# Patient Record
Sex: Male | Born: 1980
Health system: Southern US, Community
[De-identification: ages and names within clinical notes are randomized; demographics above are authoritative.]

## PROBLEM LIST (undated history)

## (undated) DIAGNOSIS — S34109A Unspecified injury to unspecified level of lumbar spinal cord, initial encounter: Secondary | ICD-10-CM

## (undated) DIAGNOSIS — R339 Retention of urine, unspecified: Secondary | ICD-10-CM

## (undated) DIAGNOSIS — R58 Hemorrhage, not elsewhere classified: Secondary | ICD-10-CM

## (undated) DIAGNOSIS — G8921 Chronic pain due to trauma: Secondary | ICD-10-CM

## (undated) DIAGNOSIS — D72829 Elevated white blood cell count, unspecified: Secondary | ICD-10-CM

## (undated) DIAGNOSIS — W3400XA Accidental discharge from unspecified firearms or gun, initial encounter: Secondary | ICD-10-CM

## (undated) DIAGNOSIS — K592 Neurogenic bowel, not elsewhere classified: Secondary | ICD-10-CM

## (undated) DIAGNOSIS — N319 Neuromuscular dysfunction of bladder, unspecified: Secondary | ICD-10-CM

## (undated) DIAGNOSIS — E870 Hyperosmolality and hypernatremia: Secondary | ICD-10-CM

## (undated) DIAGNOSIS — F419 Anxiety disorder, unspecified: Secondary | ICD-10-CM

## (undated) DIAGNOSIS — S37039A Laceration of unspecified kidney, unspecified degree, initial encounter: Secondary | ICD-10-CM

## (undated) DIAGNOSIS — G8220 Paraplegia, unspecified: Secondary | ICD-10-CM

## (undated) DIAGNOSIS — S32009A Unspecified fracture of unspecified lumbar vertebra, initial encounter for closed fracture: Secondary | ICD-10-CM

## (undated) HISTORY — PX: OTHER SURGICAL HISTORY: SHX169

---

## 2000-04-02 ENCOUNTER — Emergency Department (HOSPITAL_COMMUNITY): Admission: EM | Admit: 2000-04-02 | Discharge: 2000-04-02 | Payer: Self-pay | Admitting: Emergency Medicine

## 2003-01-08 ENCOUNTER — Emergency Department (HOSPITAL_COMMUNITY): Admission: AD | Admit: 2003-01-08 | Discharge: 2003-01-08 | Payer: Self-pay | Admitting: Family Medicine

## 2003-12-11 ENCOUNTER — Emergency Department (HOSPITAL_COMMUNITY): Admission: EM | Admit: 2003-12-11 | Discharge: 2003-12-11 | Payer: Self-pay | Admitting: Family Medicine

## 2006-01-13 ENCOUNTER — Emergency Department (HOSPITAL_COMMUNITY): Admission: EM | Admit: 2006-01-13 | Discharge: 2006-01-13 | Payer: Self-pay | Admitting: Emergency Medicine

## 2006-01-23 ENCOUNTER — Emergency Department (HOSPITAL_COMMUNITY): Admission: EM | Admit: 2006-01-23 | Discharge: 2006-01-23 | Payer: Self-pay | Admitting: Emergency Medicine

## 2006-12-13 ENCOUNTER — Emergency Department (HOSPITAL_COMMUNITY): Admission: EM | Admit: 2006-12-13 | Discharge: 2006-12-13 | Payer: Self-pay | Admitting: Emergency Medicine

## 2013-08-31 ENCOUNTER — Ambulatory Visit: Payer: BC Managed Care – PPO | Attending: Family Medicine | Admitting: Physical Therapy

## 2013-08-31 DIAGNOSIS — M6281 Muscle weakness (generalized): Secondary | ICD-10-CM | POA: Insufficient documentation

## 2013-08-31 DIAGNOSIS — IMO0001 Reserved for inherently not codable concepts without codable children: Secondary | ICD-10-CM | POA: Insufficient documentation

## 2013-08-31 DIAGNOSIS — R269 Unspecified abnormalities of gait and mobility: Secondary | ICD-10-CM | POA: Insufficient documentation

## 2013-09-01 ENCOUNTER — Ambulatory Visit: Payer: BC Managed Care – PPO | Admitting: Physical Therapy

## 2013-09-08 ENCOUNTER — Ambulatory Visit: Payer: BC Managed Care – PPO | Admitting: Physical Therapy

## 2013-09-08 DIAGNOSIS — IMO0001 Reserved for inherently not codable concepts without codable children: Secondary | ICD-10-CM | POA: Diagnosis not present

## 2013-09-11 ENCOUNTER — Ambulatory Visit: Payer: BC Managed Care – PPO | Admitting: *Deleted

## 2013-09-14 ENCOUNTER — Ambulatory Visit: Payer: BC Managed Care – PPO | Admitting: Physical Therapy

## 2013-09-16 ENCOUNTER — Ambulatory Visit: Payer: BC Managed Care – PPO | Admitting: Physical Therapy

## 2013-09-21 ENCOUNTER — Ambulatory Visit: Payer: BC Managed Care – PPO | Admitting: Physical Therapy

## 2013-09-23 ENCOUNTER — Ambulatory Visit: Payer: BC Managed Care – PPO | Admitting: Physical Therapy

## 2013-09-29 ENCOUNTER — Ambulatory Visit: Payer: BC Managed Care – PPO | Admitting: Physical Therapy

## 2013-10-01 ENCOUNTER — Ambulatory Visit: Payer: BC Managed Care – PPO | Admitting: Physical Therapy

## 2013-10-05 ENCOUNTER — Ambulatory Visit: Payer: BC Managed Care – PPO | Admitting: Physical Therapy

## 2013-10-07 ENCOUNTER — Ambulatory Visit: Payer: BC Managed Care – PPO | Admitting: Physical Therapy

## 2013-10-12 ENCOUNTER — Ambulatory Visit: Payer: BC Managed Care – PPO | Admitting: Physical Therapy

## 2013-10-14 ENCOUNTER — Ambulatory Visit: Payer: BC Managed Care – PPO | Admitting: Physical Therapy

## 2013-10-19 ENCOUNTER — Ambulatory Visit: Payer: BC Managed Care – PPO | Admitting: Physical Therapy

## 2013-10-21 ENCOUNTER — Ambulatory Visit: Payer: BC Managed Care – PPO | Admitting: Physical Therapy

## 2013-10-26 ENCOUNTER — Ambulatory Visit: Payer: BC Managed Care – PPO | Admitting: Physical Therapy

## 2013-10-28 ENCOUNTER — Ambulatory Visit: Payer: BC Managed Care – PPO | Admitting: Physical Therapy

## 2013-11-02 ENCOUNTER — Ambulatory Visit: Payer: BC Managed Care – PPO | Admitting: Physical Therapy

## 2013-11-04 ENCOUNTER — Ambulatory Visit: Payer: BC Managed Care – PPO | Admitting: Physical Therapy

## 2014-06-30 ENCOUNTER — Emergency Department (HOSPITAL_COMMUNITY)
Admission: EM | Admit: 2014-06-30 | Discharge: 2014-06-30 | Disposition: A | Discharge status: Home / Self Care | Payer: Self-pay | Attending: Emergency Medicine | Admitting: Emergency Medicine

## 2014-06-30 ENCOUNTER — Emergency Department (HOSPITAL_COMMUNITY): Payer: Self-pay

## 2014-06-30 ENCOUNTER — Encounter (HOSPITAL_COMMUNITY): Payer: Self-pay | Admitting: Emergency Medicine

## 2014-06-30 DIAGNOSIS — Z862 Personal history of diseases of the blood and blood-forming organs and certain disorders involving the immune mechanism: Secondary | ICD-10-CM | POA: Insufficient documentation

## 2014-06-30 DIAGNOSIS — Z8639 Personal history of other endocrine, nutritional and metabolic disease: Secondary | ICD-10-CM | POA: Insufficient documentation

## 2014-06-30 DIAGNOSIS — G8921 Chronic pain due to trauma: Secondary | ICD-10-CM | POA: Insufficient documentation

## 2014-06-30 DIAGNOSIS — Z87448 Personal history of other diseases of urinary system: Secondary | ICD-10-CM | POA: Insufficient documentation

## 2014-06-30 DIAGNOSIS — K8020 Calculus of gallbladder without cholecystitis without obstruction: Secondary | ICD-10-CM | POA: Insufficient documentation

## 2014-06-30 DIAGNOSIS — Z87828 Personal history of other (healed) physical injury and trauma: Secondary | ICD-10-CM | POA: Insufficient documentation

## 2014-06-30 DIAGNOSIS — Z8781 Personal history of (healed) traumatic fracture: Secondary | ICD-10-CM | POA: Insufficient documentation

## 2014-06-30 DIAGNOSIS — Z720 Tobacco use: Secondary | ICD-10-CM | POA: Insufficient documentation

## 2014-06-30 DIAGNOSIS — R198 Other specified symptoms and signs involving the digestive system and abdomen: Secondary | ICD-10-CM

## 2014-06-30 DIAGNOSIS — R1011 Right upper quadrant pain: Secondary | ICD-10-CM

## 2014-06-30 HISTORY — DX: Retention of urine, unspecified: R33.9

## 2014-06-30 HISTORY — DX: Accidental discharge from unspecified firearms or gun, initial encounter: W34.00XA

## 2014-06-30 HISTORY — DX: Hemorrhage, not elsewhere classified: R58

## 2014-06-30 HISTORY — DX: Hyperosmolality and hypernatremia: E87.0

## 2014-06-30 HISTORY — DX: Unspecified fracture of unspecified lumbar vertebra, initial encounter for closed fracture: S32.009A

## 2014-06-30 HISTORY — DX: Neuromuscular dysfunction of bladder, unspecified: N31.9

## 2014-06-30 HISTORY — DX: Unspecified injury to unspecified level of lumbar spinal cord, initial encounter: S34.109A

## 2014-06-30 HISTORY — DX: Laceration of unspecified kidney, unspecified degree, initial encounter: S37.039A

## 2014-06-30 HISTORY — DX: Elevated white blood cell count, unspecified: D72.829

## 2014-06-30 HISTORY — DX: Paraplegia, unspecified: G82.20

## 2014-06-30 HISTORY — DX: Neurogenic bowel, not elsewhere classified: K59.2

## 2014-06-30 HISTORY — DX: Chronic pain due to trauma: G89.21

## 2014-06-30 LAB — URINALYSIS, ROUTINE W REFLEX MICROSCOPIC
Bilirubin Urine: NEGATIVE
Glucose, UA: NEGATIVE mg/dL
Hgb urine dipstick: NEGATIVE
Ketones, ur: NEGATIVE mg/dL
Leukocytes, UA: NEGATIVE
Nitrite: NEGATIVE
Protein, ur: NEGATIVE mg/dL
Specific Gravity, Urine: 1.016 (ref 1.005–1.030)
Urobilinogen, UA: 0.2 mg/dL (ref 0.0–1.0)
pH: 5.5 (ref 5.0–8.0)

## 2014-06-30 LAB — COMPREHENSIVE METABOLIC PANEL
ALT: 13 U/L — ABNORMAL LOW (ref 17–63)
AST: 14 U/L — ABNORMAL LOW (ref 15–41)
Albumin: 4.2 g/dL (ref 3.5–5.0)
Alkaline Phosphatase: 62 U/L (ref 38–126)
Anion gap: 9 (ref 5–15)
BUN: 14 mg/dL (ref 6–20)
CO2: 25 mmol/L (ref 22–32)
Calcium: 9 mg/dL (ref 8.9–10.3)
Chloride: 105 mmol/L (ref 101–111)
Creatinine, Ser: 0.89 mg/dL (ref 0.61–1.24)
GFR calc Af Amer: >60 mL/min (ref >60)
GFR calc non Af Amer: >60 mL/min (ref >60)
Glucose, Bld: 104 mg/dL — ABNORMAL HIGH (ref 65–99)
Potassium: 3.7 mmol/L (ref 3.5–5.1)
Sodium: 139 mmol/L (ref 135–145)
Total Bilirubin: 0.3 mg/dL (ref 0.3–1.2)
Total Protein: 7.4 g/dL (ref 6.5–8.1)

## 2014-06-30 LAB — CBC WITH DIFFERENTIAL/PLATELET
Basophils Absolute: 0 10*3/uL (ref 0.0–0.1)
Basophils Relative: 0 % (ref 0–1)
Eosinophils Absolute: 0.2 10*3/uL (ref 0.0–0.7)
Eosinophils Relative: 3 % (ref 0–5)
HCT: 43.4 % (ref 39.0–52.0)
Hemoglobin: 14.6 g/dL (ref 13.0–17.0)
Lymphocytes Relative: 34 % (ref 12–46)
Lymphs Abs: 2.9 10*3/uL (ref 0.7–4.0)
MCH: 32.1 pg (ref 26.0–34.0)
MCHC: 33.6 g/dL (ref 30.0–36.0)
MCV: 95.4 fL (ref 78.0–100.0)
Monocytes Absolute: 0.8 10*3/uL (ref 0.1–1.0)
Monocytes Relative: 10 % (ref 3–12)
Neutro Abs: 4.5 10*3/uL (ref 1.7–7.7)
Neutrophils Relative %: 53 % (ref 43–77)
Platelets: 226 10*3/uL (ref 150–400)
RBC: 4.55 MIL/uL (ref 4.22–5.81)
RDW: 12.4 % (ref 11.5–15.5)
WBC: 8.5 10*3/uL (ref 4.0–10.5)

## 2014-06-30 LAB — LIPASE, BLOOD: Lipase: 30 U/L (ref 22–51)

## 2014-06-30 MED ORDER — NAPROXEN 500 MG PO TABS: 500.0000 mg | ORAL_TABLET | Freq: Two times a day (BID) | ORAL | Status: DC

## 2014-06-30 MED ORDER — SODIUM CHLORIDE 0.9 % IV BOLUS (SEPSIS): 1000.0000 mL | Freq: Once | INTRAVENOUS | Status: DC

## 2014-06-30 MED ORDER — KETOROLAC TROMETHAMINE 60 MG/2ML IM SOLN
60.0000 mg | Freq: Once | INTRAMUSCULAR | Status: AC
  Administered 2014-06-30: 60 mg via INTRAMUSCULAR
  Filled 2014-06-30: qty 2

## 2014-06-30 MED ORDER — KETOROLAC TROMETHAMINE 30 MG/ML IJ SOLN
30.0000 mg | Freq: Once | INTRAMUSCULAR | Status: DC
  Filled 2014-06-30: qty 1
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
## 2014-06-30 NOTE — Discharge Instructions (Signed)
Cholelithiasis °Cholelithiasis (also called gallstones) is a form of gallbladder disease in which gallstones form in your gallbladder. The gallbladder is an organ that stores bile made in the liver, which helps digest fats. Gallstones begin as small crystals and slowly grow into stones. Gallstone pain occurs when the gallbladder spasms and a gallstone is blocking the duct. Pain can also occur when a stone passes out of the duct.  °RISK FACTORS °· Being male.   °· Having multiple pregnancies. Health care providers sometimes advise removing diseased gallbladders before future pregnancies.   °· Being obese. °· Eating a diet heavy in fried foods and fat.   °· Being older than 60 years and increasing age.   °· Prolonged use of medicines containing male hormones.   °· Having diabetes mellitus.   °· Rapidly losing weight.   °· Having a family history of gallstones (heredity).   °SYMPTOMS °· Nausea.   °· Vomiting. °· Abdominal pain.   °· Yellowing of the skin (jaundice).   °· Sudden pain. It may persist from several minutes to several hours. °· Fever.   °· Tenderness to the touch.  °In some cases, when gallstones do not move into the bile duct, people have no pain or symptoms. These are called "silent" gallstones.  °TREATMENT °Silent gallstones do not need treatment. In severe cases, emergency surgery may be required. Options for treatment include: °· Surgery to remove the gallbladder. This is the most common treatment. °· Medicines. These do not always work and may take 6-12 months or more to work. °· Shock wave treatment (extracorporeal biliary lithotripsy). In this treatment an ultrasound machine sends shock waves to the gallbladder to break gallstones into smaller pieces that can pass into the intestines or be dissolved by medicine. °HOME CARE INSTRUCTIONS  °· Only take over-the-counter or prescription medicines for pain, discomfort, or fever as directed by your health care provider.   °· Follow a low-fat diet until  seen again by your health care provider. Fat causes the gallbladder to contract, which can result in pain.   °· Follow up with your health care provider as directed. Attacks are almost always recurrent and surgery is usually required for permanent treatment.   °SEEK IMMEDIATE MEDICAL CARE IF:  °· Your pain increases and is not controlled by medicines.   °· You have a fever or persistent symptoms for more than 2-3 days.   °· You have a fever and your symptoms suddenly get worse.   °· You have persistent nausea and vomiting.   °MAKE SURE YOU:  °· Understand these instructions. °· Will watch your condition. °· Will get help right away if you are not doing well or get worse. °Document Released: 01/04/2005 Document Revised: 09/10/2012 Document Reviewed: 07/02/2012 °ExitCare® Patient Information ©2015 [employer]. This information is not intended to replace advice given to you by your health care provider. Make sure you discuss any questions you have with your health care provider. ° °Biliary Colic  °Biliary colic is a steady or irregular pain in the upper abdomen. It is usually under the right side of the rib cage. It happens when gallstones interfere with the normal flow of bile from the gallbladder. Bile is a liquid that helps to digest fats. Bile is made in the liver and stored in the gallbladder. When you eat a meal, bile passes from the gallbladder through the cystic duct and the common bile duct into the small intestine. There, it mixes with partially digested food. If a gallstone blocks either of these ducts, the normal flow of bile is blocked. The muscle cells in the bile duct contract   forcefully to try to move the stone. This causes the pain of biliary colic.  SYMPTOMS   A person with biliary colic usually complains of pain in the upper abdomen. This pain can be:  In the center of the upper abdomen just below the breastbone.  In the upper-right part of the abdomen, near the gallbladder and  liver.  Spread back toward the right shoulder blade.  Nausea and vomiting.  The pain usually occurs after eating.  Biliary colic is usually triggered by the digestive system's demand for bile. The demand for bile is high after fatty meals. Symptoms can also occur when a person who has been fasting suddenly eats a very large meal. Most episodes of biliary colic pass after 1 to 5 hours. After the most intense pain passes, your abdomen may continue to ache mildly for about 24 hours. DIAGNOSIS  After you describe your symptoms, your caregiver will perform a physical exam. He or she will pay attention to the upper right portion of your belly (abdomen). This is the area of your liver and gallbladder. An ultrasound will help your caregiver look for gallstones. Specialized scans of the gallbladder may also be done. Blood tests may be done, especially if you have fever or if your pain persists. PREVENTION  Biliary colic can be prevented by controlling the risk factors for gallstones. Some of these risk factors, such as heredity, increasing age, and pregnancy are a normal part of life. Obesity and a high-fat diet are risk factors you can change through a healthy lifestyle. Women going through menopause who take hormone replacement therapy (estrogen) are also more likely to develop biliary colic. TREATMENT   Pain medication may be prescribed.  You may be encouraged to eat a fat-free diet.  If the first episode of biliary colic is severe, or episodes of colic keep retuning, surgery to remove the gallbladder (cholecystectomy) is usually recommended. This procedure can be done through small incisions using an instrument called a laparoscope. The procedure often requires a brief stay in the hospital. Some people can leave the hospital the same day. It is the most widely used treatment in people troubled by painful gallstones. It is effective and safe, with no complications in more than 90% of cases.  If  surgery cannot be done, medication that dissolves gallstones may be used. This medication is expensive and can take months or years to work. Only small stones will dissolve.  Rarely, medication to dissolve gallstones is combined with a procedure called shock-wave lithotripsy. This procedure uses carefully aimed shock waves to break up gallstones. In many people treated with this procedure, gallstones form again within a few years. PROGNOSIS  If gallstones block your cystic duct or common bile duct, you are at risk for repeated episodes of biliary colic. There is also a 25% chance that you will develop a gallbladder infection(acute cholecystitis), or some other complication of gallstones within 10 to 20 years. If you have surgery, schedule it at a time that is convenient for you and at a time when you are not sick. HOME CARE INSTRUCTIONS   Drink plenty of clear fluids.  Avoid fatty, greasy or fried foods, or any foods that make your pain worse.  Take medications as directed. SEEK MEDICAL CARE IF:   You develop a fever over 100.5 F (38.1 C).  Your pain gets worse over time.  You develop nausea that prevents you from eating and drinking.  You develop vomiting. SEEK IMMEDIATE MEDICAL CARE IF:  You have continuous or severe belly (abdominal) pain which is not relieved with medications. °· You develop nausea and vomiting which is not relieved with medications. °· You have symptoms of biliary colic and you suddenly develop a fever and shaking chills. This may signal cholecystitis. Call your caregiver immediately. °· You develop a yellow color to your skin or the white part of your eyes (jaundice). °Document Released: 06/11/2005 Document Revised: 04/02/2011 Document Reviewed: 08/21/2007 °ExitCare® Patient Information ©2015 [employer]. This information is not intended to replace advice given to you by your health care provider. Make sure you discuss any questions you have with your health care  provider. ° °

## 2014-12-13 ENCOUNTER — Emergency Department (HOSPITAL_BASED_OUTPATIENT_CLINIC_OR_DEPARTMENT_OTHER): Payer: Self-pay

## 2014-12-13 ENCOUNTER — Emergency Department (HOSPITAL_BASED_OUTPATIENT_CLINIC_OR_DEPARTMENT_OTHER)
Admission: EM | Admit: 2014-12-13 | Discharge: 2014-12-13 | Disposition: A | Discharge status: Home / Self Care | Payer: Self-pay | Attending: Emergency Medicine | Admitting: Emergency Medicine

## 2014-12-13 ENCOUNTER — Encounter (HOSPITAL_BASED_OUTPATIENT_CLINIC_OR_DEPARTMENT_OTHER): Payer: Self-pay | Admitting: Emergency Medicine

## 2014-12-13 DIAGNOSIS — Z791 Long term (current) use of non-steroidal anti-inflammatories (NSAID): Secondary | ICD-10-CM | POA: Insufficient documentation

## 2014-12-13 DIAGNOSIS — Z862 Personal history of diseases of the blood and blood-forming organs and certain disorders involving the immune mechanism: Secondary | ICD-10-CM | POA: Insufficient documentation

## 2014-12-13 DIAGNOSIS — Z8639 Personal history of other endocrine, nutritional and metabolic disease: Secondary | ICD-10-CM | POA: Insufficient documentation

## 2014-12-13 DIAGNOSIS — Z87828 Personal history of other (healed) physical injury and trauma: Secondary | ICD-10-CM | POA: Insufficient documentation

## 2014-12-13 DIAGNOSIS — Z87448 Personal history of other diseases of urinary system: Secondary | ICD-10-CM | POA: Insufficient documentation

## 2014-12-13 DIAGNOSIS — F17200 Nicotine dependence, unspecified, uncomplicated: Secondary | ICD-10-CM | POA: Insufficient documentation

## 2014-12-13 DIAGNOSIS — K8020 Calculus of gallbladder without cholecystitis without obstruction: Secondary | ICD-10-CM | POA: Insufficient documentation

## 2014-12-13 DIAGNOSIS — G8929 Other chronic pain: Secondary | ICD-10-CM | POA: Insufficient documentation

## 2014-12-13 DIAGNOSIS — Z8669 Personal history of other diseases of the nervous system and sense organs: Secondary | ICD-10-CM | POA: Insufficient documentation

## 2014-12-13 DIAGNOSIS — R1011 Right upper quadrant pain: Secondary | ICD-10-CM

## 2014-12-13 LAB — COMPREHENSIVE METABOLIC PANEL
ALT: 21 U/L (ref 17–63)
AST: 21 U/L (ref 15–41)
Albumin: 4 g/dL (ref 3.5–5.0)
Alkaline Phosphatase: 52 U/L (ref 38–126)
Anion gap: 6 (ref 5–15)
BUN: 15 mg/dL (ref 6–20)
CO2: 27 mmol/L (ref 22–32)
Calcium: 8.7 mg/dL — ABNORMAL LOW (ref 8.9–10.3)
Chloride: 106 mmol/L (ref 101–111)
Creatinine, Ser: 1.13 mg/dL (ref 0.61–1.24)
GFR calc Af Amer: >60 mL/min (ref >60)
GFR calc non Af Amer: >60 mL/min (ref >60)
Glucose, Bld: 98 mg/dL (ref 65–99)
Potassium: 4 mmol/L (ref 3.5–5.1)
Sodium: 139 mmol/L (ref 135–145)
Total Bilirubin: 0.5 mg/dL (ref 0.3–1.2)
Total Protein: 7 g/dL (ref 6.5–8.1)

## 2014-12-13 LAB — URINALYSIS, ROUTINE W REFLEX MICROSCOPIC
Bilirubin Urine: NEGATIVE
Glucose, UA: NEGATIVE mg/dL
Hgb urine dipstick: NEGATIVE
Ketones, ur: NEGATIVE mg/dL
Leukocytes, UA: NEGATIVE
Nitrite: NEGATIVE
Protein, ur: NEGATIVE mg/dL
Specific Gravity, Urine: 1.022 (ref 1.005–1.030)
pH: 6 (ref 5.0–8.0)

## 2014-12-13 LAB — CBC WITH DIFFERENTIAL/PLATELET
Basophils Absolute: 0 10*3/uL (ref 0.0–0.1)
Basophils Relative: 0 %
Eosinophils Absolute: 0.2 10*3/uL (ref 0.0–0.7)
Eosinophils Relative: 2 %
HCT: 41.8 % (ref 39.0–52.0)
Hemoglobin: 14 g/dL (ref 13.0–17.0)
Lymphocytes Relative: 28 %
Lymphs Abs: 2.7 10*3/uL (ref 0.7–4.0)
MCH: 31.3 pg (ref 26.0–34.0)
MCHC: 33.5 g/dL (ref 30.0–36.0)
MCV: 93.5 fL (ref 78.0–100.0)
Monocytes Absolute: 0.9 10*3/uL (ref 0.1–1.0)
Monocytes Relative: 9 %
Neutro Abs: 5.8 10*3/uL (ref 1.7–7.7)
Neutrophils Relative %: 61 %
Platelets: 228 10*3/uL (ref 150–400)
RBC: 4.47 MIL/uL (ref 4.22–5.81)
RDW: 12.2 % (ref 11.5–15.5)
WBC: 9.5 10*3/uL (ref 4.0–10.5)

## 2014-12-13 LAB — LIPASE, BLOOD: Lipase: 33 U/L (ref 11–51)

## 2014-12-13 MED ORDER — KETOROLAC TROMETHAMINE 30 MG/ML IJ SOLN
30.0000 mg | Freq: Once | INTRAMUSCULAR | Status: AC
  Administered 2014-12-13: 30 mg via INTRAVENOUS
  Filled 2014-12-13: qty 1

## 2014-12-13 NOTE — Discharge Instructions (Signed)
You have been given a referral to see a general surgeon if you continue to have recurrent pain relating to your gallbladder. Return without fail for worsening symptoms including worsening pain, fever, vomiting unable to keep down food or fluids, or any other symptoms concerning to you.   Biliary Colic Biliary colic is a pain in the upper abdomen. The pain:  Is usually felt on the right side of the abdomen, but it may also be felt in the center of the abdomen, just below the breastbone (sternum).  May spread back toward the right shoulder blade.  May be steady or irregular.  May be accompanied by nausea and vomiting. Most of the time, the pain goes away in 1-5 hours. After the most intense pain passes, the abdomen may continue to ache mildly for about 24 hours. Biliary colic is caused by a blockage in the bile duct. The bile duct is a pathway that carries bile--a liquid that helps to digest fats--from the gallbladder to the small intestine. Biliary colic usually occurs after eating, when the digestive system demands bile. The pain develops when muscle cells contract forcefully to try to move the blockage so that bile can get by. HOME CARE INSTRUCTIONS  Take medicines only as directed by your health care provider.  Drink enough fluid to keep your urine clear or pale yellow.  Avoid fatty, greasy, and fried foods. These kinds of foods increase your body's demand for bile.  Avoid any foods that make your pain worse.  Avoid overeating.  Avoid having a large meal after fasting. SEEK MEDICAL CARE IF:  You develop a fever.  Your pain gets worse.  You vomit.  You develop nausea that prevents you from eating and drinking. SEEK IMMEDIATE MEDICAL CARE IF:  You suddenly develop a fever and shaking chills.  You develop a yellowish discoloration (jaundice) of:  Skin.  Whites of the eyes.  Mucous membranes.  You have continuous or severe pain that is not relieved with  medicines.  You have nausea and vomiting that is not relieved with medicines.  You develop dizziness or you faint.   This information is not intended to replace advice given to you by your health care provider. Make sure you discuss any questions you have with your health care provider.   Document Released: 06/11/2005 Document Revised: 05/25/2014 Document Reviewed: 10/20/2013 Elsevier Interactive Patient Education [employer]2016 Elsevier Inc.
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
## 2014-12-13 NOTE — ED Notes (Signed)
Patient transported to & from xray. 

## 2014-12-13 NOTE — ED Notes (Signed)
Patient transported to Ultrasound 

## 2014-12-13 NOTE — ED Notes (Signed)
Pain RUQ onset yesterday

## 2014-12-13 NOTE — ED Notes (Signed)
Unable to void at present

## 2015-07-05 ENCOUNTER — Encounter (HOSPITAL_COMMUNITY): Payer: Self-pay

## 2015-07-05 ENCOUNTER — Emergency Department (HOSPITAL_COMMUNITY)
Admission: EM | Admit: 2015-07-05 | Discharge: 2015-07-05 | Disposition: A | Discharge status: Home / Self Care | Payer: Self-pay | Attending: Emergency Medicine | Admitting: Emergency Medicine

## 2015-07-05 DIAGNOSIS — F17200 Nicotine dependence, unspecified, uncomplicated: Secondary | ICD-10-CM | POA: Insufficient documentation

## 2015-07-05 DIAGNOSIS — M545 Low back pain, unspecified: Secondary | ICD-10-CM

## 2015-07-05 MED ORDER — DIAZEPAM 5 MG/ML IJ SOLN
5.0000 mg | Freq: Once | INTRAMUSCULAR | Status: AC
  Administered 2015-07-05: 5 mg via INTRAMUSCULAR
  Filled 2015-07-05: qty 2

## 2015-07-05 MED ORDER — CYCLOBENZAPRINE HCL 10 MG PO TABS: 10.0000 mg | ORAL_TABLET | Freq: Two times a day (BID) | ORAL | Status: DC | PRN

## 2015-07-05 MED ORDER — TRAMADOL HCL 50 MG PO TABS
50.0000 mg | ORAL_TABLET | Freq: Once | ORAL | Status: AC
  Administered 2015-07-05: 50 mg via ORAL
  Filled 2015-07-05: qty 1
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
## 2015-07-05 NOTE — ED Notes (Addendum)
Pt verbalized understanding of d/c instructions and has no further questions. Pt stable and NAD. Pt d/c home with family driving.  
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
## 2015-07-05 NOTE — Discharge Instructions (Signed)

## 2015-08-05 ENCOUNTER — Emergency Department (HOSPITAL_COMMUNITY): Payer: Medicare Other

## 2015-08-05 ENCOUNTER — Encounter (HOSPITAL_COMMUNITY): Payer: Self-pay | Admitting: Emergency Medicine

## 2015-08-05 ENCOUNTER — Emergency Department (HOSPITAL_COMMUNITY)
Admission: EM | Admit: 2015-08-05 | Discharge: 2015-08-05 | Disposition: A | Discharge status: Home / Self Care | Payer: Medicare Other | Attending: Dermatology | Admitting: Dermatology

## 2015-08-05 DIAGNOSIS — S41011A Laceration without foreign body of right shoulder, initial encounter: Secondary | ICD-10-CM | POA: Diagnosis not present

## 2015-08-05 DIAGNOSIS — Y92009 Unspecified place in unspecified non-institutional (private) residence as the place of occurrence of the external cause: Secondary | ICD-10-CM | POA: Diagnosis not present

## 2015-08-05 DIAGNOSIS — Y939 Activity, unspecified: Secondary | ICD-10-CM | POA: Diagnosis not present

## 2015-08-05 DIAGNOSIS — S51812A Laceration without foreign body of left forearm, initial encounter: Secondary | ICD-10-CM | POA: Diagnosis not present

## 2015-08-05 DIAGNOSIS — Y999 Unspecified external cause status: Secondary | ICD-10-CM | POA: Diagnosis not present

## 2015-08-05 DIAGNOSIS — Z5321 Procedure and treatment not carried out due to patient leaving prior to being seen by health care provider: Secondary | ICD-10-CM | POA: Insufficient documentation

## 2015-08-05 DIAGNOSIS — F17200 Nicotine dependence, unspecified, uncomplicated: Secondary | ICD-10-CM | POA: Insufficient documentation

## 2015-08-05 NOTE — ED Notes (Signed)
Unable to locate pt in waiting room.

## 2015-08-05 NOTE — ED Notes (Signed)
Pt states he is leaving.  Encouraged pt to stay and explained triage process and wait.  Wife states they are waiting for 1 more hour.  PT states he is not waiting.  Wife again stated they were waiting.
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
## 2015-08-05 NOTE — ED Notes (Signed)
Pt mad and left.

## 2015-08-05 NOTE — ED Notes (Signed)
No answer in waiting room 

## 2015-11-02 ENCOUNTER — Encounter (HOSPITAL_COMMUNITY): Payer: Self-pay | Admitting: Emergency Medicine

## 2015-11-02 ENCOUNTER — Emergency Department (HOSPITAL_COMMUNITY): Payer: Medicare Other

## 2015-11-02 ENCOUNTER — Emergency Department (HOSPITAL_COMMUNITY)
Admission: EM | Admit: 2015-11-02 | Discharge: 2015-11-02 | Disposition: A | Discharge status: Home / Self Care | Payer: Medicare Other | Attending: Emergency Medicine | Admitting: Emergency Medicine

## 2015-11-02 DIAGNOSIS — K8020 Calculus of gallbladder without cholecystitis without obstruction: Secondary | ICD-10-CM | POA: Diagnosis not present

## 2015-11-02 DIAGNOSIS — R1011 Right upper quadrant pain: Secondary | ICD-10-CM | POA: Diagnosis present

## 2015-11-02 DIAGNOSIS — Z79899 Other long term (current) drug therapy: Secondary | ICD-10-CM | POA: Insufficient documentation

## 2015-11-02 DIAGNOSIS — F17200 Nicotine dependence, unspecified, uncomplicated: Secondary | ICD-10-CM | POA: Insufficient documentation

## 2015-11-02 LAB — COMPREHENSIVE METABOLIC PANEL
ALK PHOS: 58 U/L (ref 38–126)
ALT: 14 U/L — ABNORMAL LOW (ref 17–63)
ANION GAP: 10 (ref 5–15)
AST: 18 U/L (ref 15–41)
Albumin: 4.5 g/dL (ref 3.5–5.0)
BUN: 16 mg/dL (ref 6–20)
CO2: 24 mmol/L (ref 22–32)
Calcium: 9.3 mg/dL (ref 8.9–10.3)
Chloride: 105 mmol/L (ref 101–111)
Creatinine, Ser: 1.08 mg/dL (ref 0.61–1.24)
Glucose, Bld: 94 mg/dL (ref 65–99)
Potassium: 4.1 mmol/L (ref 3.5–5.1)
SODIUM: 139 mmol/L (ref 135–145)
TOTAL PROTEIN: 8.1 g/dL (ref 6.5–8.1)
Total Bilirubin: 0.7 mg/dL (ref 0.3–1.2)

## 2015-11-02 LAB — CBC WITH DIFFERENTIAL/PLATELET
Basophils Absolute: 0 10*3/uL (ref 0.0–0.1)
Basophils Relative: 0 %
EOS PCT: 1 %
Eosinophils Absolute: 0.2 10*3/uL (ref 0.0–0.7)
HCT: 49.7 % (ref 39.0–52.0)
Hemoglobin: 17.3 g/dL — ABNORMAL HIGH (ref 13.0–17.0)
LYMPHS ABS: 2.9 10*3/uL (ref 0.7–4.0)
LYMPHS PCT: 18 %
MCH: 34.2 pg — AB (ref 26.0–34.0)
MCHC: 34.8 g/dL (ref 30.0–36.0)
MCV: 98.2 fL (ref 78.0–100.0)
MONO ABS: 0.8 10*3/uL (ref 0.1–1.0)
Monocytes Relative: 5 %
Neutro Abs: 12.1 10*3/uL — ABNORMAL HIGH (ref 1.7–7.7)
Neutrophils Relative %: 76 %
PLATELETS: 212 10*3/uL (ref 150–400)
RBC: 5.06 MIL/uL (ref 4.22–5.81)
RDW: 12.8 % (ref 11.5–15.5)
WBC: 15.9 10*3/uL — ABNORMAL HIGH (ref 4.0–10.5)

## 2015-11-02 LAB — URINALYSIS, ROUTINE W REFLEX MICROSCOPIC
BILIRUBIN URINE: NEGATIVE
GLUCOSE, UA: NEGATIVE mg/dL
KETONES UR: NEGATIVE mg/dL
Nitrite: NEGATIVE
PROTEIN: NEGATIVE mg/dL
Specific Gravity, Urine: 1.017 (ref 1.005–1.030)
pH: 5.5 (ref 5.0–8.0)

## 2015-11-02 LAB — URINE MICROSCOPIC-ADD ON

## 2015-11-02 LAB — LIPASE, BLOOD: Lipase: 39 U/L (ref 11–51)

## 2015-11-02 MED ORDER — ONDANSETRON HCL 4 MG/2ML IJ SOLN
4.0000 mg | Freq: Once | INTRAMUSCULAR | Status: AC
  Administered 2015-11-02: 4 mg via INTRAVENOUS
  Filled 2015-11-02: qty 2

## 2015-11-02 MED ORDER — HYDROCODONE-ACETAMINOPHEN 5-325 MG PO TABS: 1.0000 | ORAL_TABLET | ORAL | 0 refills | Status: DC | PRN

## 2015-11-02 MED ORDER — HYDROMORPHONE HCL 1 MG/ML IJ SOLN
1.0000 mg | Freq: Once | INTRAMUSCULAR | Status: AC
  Administered 2015-11-02: 1 mg via INTRAVENOUS
  Filled 2015-11-02: qty 1

## 2015-11-02 MED ORDER — ONDANSETRON 4 MG PO TBDP: 4.0000 mg | ORAL_TABLET | Freq: Three times a day (TID) | ORAL | 0 refills | Status: DC | PRN

## 2015-11-02 NOTE — ED Notes (Signed)
WATER GIVEN 
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
## 2015-11-02 NOTE — ED Notes (Signed)
One unsuccessful attempt at an iv.

## 2015-11-02 NOTE — ED Notes (Signed)
PT HAS RIDE WITH WIFE

## 2015-11-02 NOTE — Discharge Instructions (Signed)
Take your medications as prescribed as needed for nausea and pain. Continue drinking fluids at home to remain hydrated. Call the general surgery clinic listed above to schedule a follow-up appointment for further management of your gallstones. Please return to the Emergency Department if symptoms worsen or new onset of fever, new/worsening abdominal pain, vomiting, unable to keep fluids down.
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
## 2015-11-02 NOTE — ED Triage Notes (Signed)
Pt is c/o right upper quadrant pain that radiates into his back  Pt states he has problems with his gallbladder  Pt states the pain started around 10pm and has gotten worse  Pt states he has had N/V/D

## 2015-11-04 LAB — URINE CULTURE

## 2015-11-05 ENCOUNTER — Telehealth (HOSPITAL_BASED_OUTPATIENT_CLINIC_OR_DEPARTMENT_OTHER): Payer: Self-pay

## 2015-11-05 NOTE — Telephone Encounter (Signed)
Post ED Visit - Positive Culture Follow-up  Culture report reviewed by antimicrobial stewardship pharmacist:  []  [name], Pharm.D. []  [name], Pharm.D., BCPS []  [name], Pharm.D. []  [name], Pharm.D., BCPS []  [name], [address] BoulevardPharm[address]D., BCPS, AAHIVP []  [name], Pharm.D., BCPS, AAHIVP []  [name], Pharm.D. []  [name], [address]Pharm[address] [name] Pharm D Positive urine culture and no further patient follow-up is required at this time.  [name], [name] 11/05/2015, 12:07 PM

## 2015-12-21 ENCOUNTER — Emergency Department (HOSPITAL_COMMUNITY)
Admission: EM | Admit: 2015-12-21 | Discharge: 2015-12-21 | Disposition: A | Discharge status: Home / Self Care | Payer: Medicare Other | Attending: Emergency Medicine | Admitting: Emergency Medicine

## 2015-12-21 DIAGNOSIS — Y999 Unspecified external cause status: Secondary | ICD-10-CM | POA: Insufficient documentation

## 2015-12-21 DIAGNOSIS — F10129 Alcohol abuse with intoxication, unspecified: Secondary | ICD-10-CM | POA: Diagnosis present

## 2015-12-21 DIAGNOSIS — Y939 Activity, unspecified: Secondary | ICD-10-CM | POA: Insufficient documentation

## 2015-12-21 DIAGNOSIS — F17200 Nicotine dependence, unspecified, uncomplicated: Secondary | ICD-10-CM | POA: Insufficient documentation

## 2015-12-21 DIAGNOSIS — Y929 Unspecified place or not applicable: Secondary | ICD-10-CM | POA: Insufficient documentation

## 2015-12-21 DIAGNOSIS — Z5321 Procedure and treatment not carried out due to patient leaving prior to being seen by health care provider: Secondary | ICD-10-CM | POA: Diagnosis not present

## 2015-12-21 DIAGNOSIS — W19XXXA Unspecified fall, initial encounter: Secondary | ICD-10-CM | POA: Diagnosis not present

## 2015-12-21 NOTE — ED Notes (Signed)
Pt stating [facility] Pt yelling and cursing at staff [facility] . Pt yelling and cursing that he wanted the IV removed so pt IV was removed by Florentina AddisonKatie, EMT prior to the pt leaving the room cursing at staff again that he was leaving.
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
## 2015-12-21 NOTE — ED Notes (Signed)
Pt brought in by EMS with C collar and back board in place, when pt was being transferred from EMS stretcher to bed. He became combative and ripped his c-collar off and threw it at the wall, sat up and refused to lay back down or put c-collar back on.

## 2016-01-19 ENCOUNTER — Emergency Department (HOSPITAL_COMMUNITY): Payer: Medicare Other

## 2016-01-19 ENCOUNTER — Encounter (HOSPITAL_COMMUNITY): Payer: Self-pay | Admitting: Emergency Medicine

## 2016-01-19 ENCOUNTER — Emergency Department (HOSPITAL_COMMUNITY)
Admission: EM | Admit: 2016-01-19 | Discharge: 2016-01-19 | Disposition: A | Discharge status: Home / Self Care | Payer: Medicare Other | Attending: Emergency Medicine | Admitting: Emergency Medicine

## 2016-01-19 DIAGNOSIS — F17200 Nicotine dependence, unspecified, uncomplicated: Secondary | ICD-10-CM | POA: Insufficient documentation

## 2016-01-19 DIAGNOSIS — R1011 Right upper quadrant pain: Secondary | ICD-10-CM | POA: Insufficient documentation

## 2016-01-19 LAB — URINALYSIS, ROUTINE W REFLEX MICROSCOPIC
BILIRUBIN URINE: NEGATIVE
GLUCOSE, UA: NEGATIVE mg/dL
HGB URINE DIPSTICK: NEGATIVE
Ketones, ur: NEGATIVE mg/dL
NITRITE: NEGATIVE
PH: 5 (ref 5.0–8.0)
Protein, ur: NEGATIVE mg/dL
SPECIFIC GRAVITY, URINE: 1.023 (ref 1.005–1.030)

## 2016-01-19 LAB — COMPREHENSIVE METABOLIC PANEL
ALT: 11 U/L — AB (ref 17–63)
AST: 19 U/L (ref 15–41)
Albumin: 5.1 g/dL — ABNORMAL HIGH (ref 3.5–5.0)
Alkaline Phosphatase: 56 U/L (ref 38–126)
Anion gap: 9 (ref 5–15)
BUN: 13 mg/dL (ref 6–20)
CALCIUM: 8.8 mg/dL — AB (ref 8.9–10.3)
CHLORIDE: 107 mmol/L (ref 101–111)
CO2: 26 mmol/L (ref 22–32)
CREATININE: 1.01 mg/dL (ref 0.61–1.24)
Glucose, Bld: 115 mg/dL — ABNORMAL HIGH (ref 65–99)
Potassium: 4.2 mmol/L (ref 3.5–5.1)
Sodium: 142 mmol/L (ref 135–145)
TOTAL PROTEIN: 8 g/dL (ref 6.5–8.1)
Total Bilirubin: 0.5 mg/dL (ref 0.3–1.2)

## 2016-01-19 LAB — LIPASE, BLOOD: LIPASE: 30 U/L (ref 11–51)

## 2016-01-19 LAB — CBC WITH DIFFERENTIAL/PLATELET
Basophils Absolute: 0 10*3/uL (ref 0.0–0.1)
Basophils Relative: 0 %
EOS ABS: 0.1 10*3/uL (ref 0.0–0.7)
Eosinophils Relative: 0 %
HCT: 41.4 % (ref 39.0–52.0)
HEMOGLOBIN: 14.4 g/dL (ref 13.0–17.0)
LYMPHS ABS: 1.3 10*3/uL (ref 0.7–4.0)
LYMPHS PCT: 9 %
MCH: 32.4 pg (ref 26.0–34.0)
MCHC: 34.8 g/dL (ref 30.0–36.0)
MCV: 93.2 fL (ref 78.0–100.0)
MONOS PCT: 6 %
Monocytes Absolute: 0.9 10*3/uL (ref 0.1–1.0)
Neutro Abs: 12.3 10*3/uL — ABNORMAL HIGH (ref 1.7–7.7)
Neutrophils Relative %: 85 %
Platelets: 204 10*3/uL (ref 150–400)
RBC: 4.44 MIL/uL (ref 4.22–5.81)
RDW: 12.5 % (ref 11.5–15.5)
WBC: 14.7 10*3/uL — ABNORMAL HIGH (ref 4.0–10.5)

## 2016-01-19 MED ORDER — MORPHINE SULFATE (PF) 4 MG/ML IV SOLN
4.0000 mg | Freq: Once | INTRAVENOUS | Status: AC
  Administered 2016-01-19: 4 mg via INTRAVENOUS
  Filled 2016-01-19: qty 1

## 2016-01-19 MED ORDER — ONDANSETRON HCL 4 MG/2ML IJ SOLN
4.0000 mg | Freq: Once | INTRAMUSCULAR | Status: AC
  Administered 2016-01-19: 4 mg via INTRAVENOUS
  Filled 2016-01-19: qty 2

## 2016-01-19 MED ORDER — HYDROCODONE-ACETAMINOPHEN 5-325 MG PO TABS: 2.0000 | ORAL_TABLET | ORAL | 0 refills | Status: DC | PRN

## 2016-01-19 MED ORDER — IOPAMIDOL (ISOVUE-300) INJECTION 61%
100.0000 mL | Freq: Once | INTRAVENOUS | Status: AC | PRN
  Administered 2016-01-19: 1 mL via INTRAVENOUS
  Administered 2016-01-19: 100 mL via INTRAVENOUS

## 2016-01-19 MED ORDER — ONDANSETRON HCL 4 MG PO TABS: 4.0000 mg | ORAL_TABLET | Freq: Four times a day (QID) | ORAL | 0 refills | Status: DC

## 2016-01-19 MED ORDER — IOPAMIDOL (ISOVUE-300) INJECTION 61%
INTRAVENOUS | Status: AC
  Administered 2016-01-19: 1 mL via INTRAVENOUS
  Filled 2016-01-19: qty 100

## 2016-01-19 MED ORDER — SODIUM CHLORIDE 0.9 % IV BOLUS (SEPSIS)
1000.0000 mL | Freq: Once | INTRAVENOUS | Status: AC
  Administered 2016-01-19: 1000 mL via INTRAVENOUS

## 2016-01-19 NOTE — ED Triage Notes (Signed)
Pt states he is having abd pain that started about 0330 during the night  Pt states he has known gallstones and this is the same kind of pain  Pt states he has had nausea and vomiting and loose stools   Pt states the pain is mainly in his right side and radiates into his back

## 2016-01-19 NOTE — Discharge Instructions (Signed)
You were seen in the ED with abdominal pain likely related to gallstones. We had the surgery team see you but you were not interested in surgery at this time. Call your surgery team today and schedule an outpatient visit to discuss options for treatment.   Avoid fatty foods. Return to the ED with any worsening abdominal pain, vomiting, or fever.
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
## 2016-01-19 NOTE — ED Notes (Signed)
Attempted blood draw with no success.     
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
## 2016-01-26 ENCOUNTER — Other Ambulatory Visit: Payer: Self-pay | Admitting: Surgery

## 2016-01-26 DIAGNOSIS — K829 Disease of gallbladder, unspecified: Secondary | ICD-10-CM | POA: Diagnosis not present

## 2016-02-10 NOTE — Patient Instructions (Signed)
Alexander BussingDavid C Nguyen  02/10/2016   Your procedure is scheduled on: Tuesday 02/14/2016  Report to [facility] Main  Entrance [place]  elevators to 3rd floor to  Short Stay Center at   0530  AM.  Call this number if you have problems the morning of surgery [phone]   Remember: ONLY 1 PERSON MAY GO WITH YOU TO SHORT STAY TO GET  READY MORNING OF YOUR SURGERY.   Do not eat food or drink liquids :After Midnight.     Take these medicines the morning of surgery with A SIP OF WATER: NONE                                You may not have any metal on your body including hair pins and              piercings  Do not wear jewelry, make-up, lotions, powders or perfumes, deodorant             Do not wear nail polish.  Do not shave  48 hours prior to surgery.              Men may shave face and neck.   Do not bring valuables to the hospital. [place] [facility] IS NOT             RESPONSIBLE   FOR VALUABLES.  Contacts, dentures or bridgework may not be worn into surgery.  Leave suitcase in the car. After surgery it may be brought to your room.                 Please read over the following fact sheets you were given: _____________________________________________________________________             [facility] - Preparing for Surgery Before surgery, you can play an important role.  Because skin is not sterile, your skin needs to be as free of germs as possible.  You can reduce the number of germs on your skin by washing with CHG (chlorahexidine gluconate) soap before surgery.  CHG is an antiseptic cleaner which kills germs and bonds with the skin to continue killing germs even after washing. Please DO NOT use if you have an allergy to CHG or antibacterial soaps.  If your skin becomes reddened/irritated stop using the CHG and inform your nurse when you arrive at Short Stay. Do not shave (including legs and underarms) for at least 48 hours prior to the first CHG shower.  You may shave  your face/neck. Please follow these instructions carefully:  1.  Shower with CHG Soap the night before surgery and the  morning of Surgery.  2.  If you choose to wash your hair, wash your hair first as usual with your  normal  shampoo.  3.  After you shampoo, rinse your hair and body thoroughly to remove the  shampoo.                           4.  Use CHG as you would any other liquid soap.  You can apply chg directly  to the skin and wash                       Gently with a scrungie or clean washcloth.  5.  Apply the CHG Soap to your body ONLY FROM THE NECK DOWN.   Do not use on face/ open                           Wound or open sores. Avoid contact with eyes, ears mouth and genitals (private parts).                       Wash face,  Genitals (private parts) with your normal soap.             6.  Wash thoroughly, paying special attention to the area where your surgery  will be performed.  7.  Thoroughly rinse your body with warm water from the neck down.  8.  DO NOT shower/wash with your normal soap after using and rinsing off  the CHG Soap.                9.  Pat yourself dry with a clean towel.            10.  Wear clean pajamas.            11.  Place clean sheets on your bed the night of your first shower and do not  sleep with pets. Day of Surgery : Do not apply any lotions/deodorants the morning of surgery.  Please wear clean clothes to the hospital/surgery center.  FAILURE TO FOLLOW THESE INSTRUCTIONS MAY RESULT IN THE CANCELLATION OF YOUR SURGERY PATIENT SIGNATURE_________________________________  NURSE SIGNATURE__________________________________  ________________________________________________________________________

## 2016-02-13 ENCOUNTER — Encounter (HOSPITAL_COMMUNITY)
Admission: RE | Admit: 2016-02-13 | Discharge: 2016-02-13 | Disposition: A | Discharge status: Home / Self Care | Payer: Medicare HMO | Source: Ambulatory Visit | Attending: Surgery | Admitting: Surgery

## 2016-02-13 ENCOUNTER — Encounter (HOSPITAL_COMMUNITY): Payer: Self-pay

## 2016-02-13 DIAGNOSIS — K8010 Calculus of gallbladder with chronic cholecystitis without obstruction: Secondary | ICD-10-CM

## 2016-02-13 DIAGNOSIS — Z01818 Encounter for other preprocedural examination: Secondary | ICD-10-CM | POA: Insufficient documentation

## 2016-02-13 DIAGNOSIS — K660 Peritoneal adhesions (postprocedural) (postinfection): Secondary | ICD-10-CM | POA: Diagnosis not present

## 2016-02-13 DIAGNOSIS — F17210 Nicotine dependence, cigarettes, uncomplicated: Secondary | ICD-10-CM | POA: Diagnosis not present

## 2016-02-13 DIAGNOSIS — R531 Weakness: Secondary | ICD-10-CM | POA: Diagnosis not present

## 2016-02-13 DIAGNOSIS — Z905 Acquired absence of kidney: Secondary | ICD-10-CM | POA: Diagnosis not present

## 2016-02-13 DIAGNOSIS — M549 Dorsalgia, unspecified: Secondary | ICD-10-CM | POA: Diagnosis not present

## 2016-02-13 DIAGNOSIS — F419 Anxiety disorder, unspecified: Secondary | ICD-10-CM | POA: Diagnosis not present

## 2016-02-13 DIAGNOSIS — Z87828 Personal history of other (healed) physical injury and trauma: Secondary | ICD-10-CM | POA: Diagnosis not present

## 2016-02-13 HISTORY — DX: Anxiety disorder, unspecified: F41.9

## 2016-02-13 LAB — COMPREHENSIVE METABOLIC PANEL
ALT: 18 U/L (ref 17–63)
ANION GAP: 10 (ref 5–15)
AST: 19 U/L (ref 15–41)
Albumin: 4.8 g/dL (ref 3.5–5.0)
Alkaline Phosphatase: 71 U/L (ref 38–126)
BILIRUBIN TOTAL: 0.6 mg/dL (ref 0.3–1.2)
BUN: 12 mg/dL (ref 6–20)
CO2: 27 mmol/L (ref 22–32)
Calcium: 8.6 mg/dL — ABNORMAL LOW (ref 8.9–10.3)
Chloride: 104 mmol/L (ref 101–111)
Creatinine, Ser: 1.02 mg/dL (ref 0.61–1.24)
Glucose, Bld: 95 mg/dL (ref 65–99)
POTASSIUM: 3.9 mmol/L (ref 3.5–5.1)
Sodium: 141 mmol/L (ref 135–145)
TOTAL PROTEIN: 8.2 g/dL — AB (ref 6.5–8.1)

## 2016-02-13 LAB — CBC WITH DIFFERENTIAL/PLATELET
Basophils Absolute: 0 10*3/uL (ref 0.0–0.1)
Basophils Relative: 0 %
Eosinophils Absolute: 0.3 10*3/uL (ref 0.0–0.7)
Eosinophils Relative: 3 %
HEMATOCRIT: 44.6 % (ref 39.0–52.0)
Hemoglobin: 15.8 g/dL (ref 13.0–17.0)
LYMPHS PCT: 47 %
Lymphs Abs: 4.3 10*3/uL — ABNORMAL HIGH (ref 0.7–4.0)
MCH: 33.8 pg (ref 26.0–34.0)
MCHC: 35.4 g/dL (ref 30.0–36.0)
MCV: 95.3 fL (ref 78.0–100.0)
MONO ABS: 0.6 10*3/uL (ref 0.1–1.0)
Monocytes Relative: 6 %
NEUTROS ABS: 4 10*3/uL (ref 1.7–7.7)
Neutrophils Relative %: 44 %
Platelets: 295 10*3/uL (ref 150–400)
RBC: 4.68 MIL/uL (ref 4.22–5.81)
RDW: 12.9 % (ref 11.5–15.5)
WBC: 9.2 10*3/uL (ref 4.0–10.5)
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
## 2016-02-14 ENCOUNTER — Observation Stay (HOSPITAL_COMMUNITY)
Admission: RE | Admit: 2016-02-14 | Discharge: 2016-02-15 | Disposition: A | Discharge status: Home / Self Care | Payer: Medicare HMO | Source: Ambulatory Visit | Attending: Surgery | Admitting: Surgery

## 2016-02-14 ENCOUNTER — Encounter (HOSPITAL_COMMUNITY)
Admission: RE | Disposition: A | Discharge status: Home / Self Care | Payer: Self-pay | Source: Ambulatory Visit | Attending: Surgery

## 2016-02-14 ENCOUNTER — Encounter (HOSPITAL_COMMUNITY): Payer: Self-pay | Admitting: *Deleted

## 2016-02-14 ENCOUNTER — Ambulatory Visit (HOSPITAL_COMMUNITY): Payer: Medicare HMO | Admitting: Anesthesiology

## 2016-02-14 ENCOUNTER — Ambulatory Visit (HOSPITAL_COMMUNITY): Payer: Medicare HMO

## 2016-02-14 DIAGNOSIS — M549 Dorsalgia, unspecified: Secondary | ICD-10-CM | POA: Insufficient documentation

## 2016-02-14 DIAGNOSIS — K8020 Calculus of gallbladder without cholecystitis without obstruction: Secondary | ICD-10-CM

## 2016-02-14 DIAGNOSIS — F419 Anxiety disorder, unspecified: Secondary | ICD-10-CM | POA: Diagnosis not present

## 2016-02-14 DIAGNOSIS — Z905 Acquired absence of kidney: Secondary | ICD-10-CM | POA: Insufficient documentation

## 2016-02-14 DIAGNOSIS — K8011 Calculus of gallbladder with chronic cholecystitis with obstruction: Secondary | ICD-10-CM | POA: Diagnosis not present

## 2016-02-14 DIAGNOSIS — F17210 Nicotine dependence, cigarettes, uncomplicated: Secondary | ICD-10-CM | POA: Diagnosis not present

## 2016-02-14 DIAGNOSIS — K660 Peritoneal adhesions (postprocedural) (postinfection): Secondary | ICD-10-CM | POA: Insufficient documentation

## 2016-02-14 DIAGNOSIS — R1011 Right upper quadrant pain: Secondary | ICD-10-CM | POA: Diagnosis not present

## 2016-02-14 DIAGNOSIS — K8010 Calculus of gallbladder with chronic cholecystitis without obstruction: Principal | ICD-10-CM | POA: Diagnosis present

## 2016-02-14 DIAGNOSIS — Z87828 Personal history of other (healed) physical injury and trauma: Secondary | ICD-10-CM | POA: Insufficient documentation

## 2016-02-14 DIAGNOSIS — R531 Weakness: Secondary | ICD-10-CM | POA: Insufficient documentation

## 2016-02-14 DIAGNOSIS — K819 Cholecystitis, unspecified: Secondary | ICD-10-CM

## 2016-02-14 HISTORY — PX: CHOLECYSTECTOMY: SHX55

## 2016-02-14 SURGERY — LAPAROSCOPIC CHOLECYSTECTOMY WITH INTRAOPERATIVE CHOLANGIOGRAM
Anesthesia: General

## 2016-02-14 MED ORDER — MORPHINE SULFATE (PF) 2 MG/ML IV SOLN
1.0000 mg | INTRAVENOUS | Status: DC | PRN
  Administered 2016-02-14 (×2): 2 mg via INTRAVENOUS
  Filled 2016-02-14 (×2): qty 1

## 2016-02-14 MED ORDER — PROPOFOL 10 MG/ML IV BOLUS
INTRAVENOUS | Status: AC
  Filled 2016-02-14: qty 20

## 2016-02-14 MED ORDER — BUPIVACAINE-EPINEPHRINE 0.25% -1:200000 IJ SOLN
INTRAMUSCULAR | Status: DC | PRN
  Administered 2016-02-14: 30 mL

## 2016-02-14 MED ORDER — PROMETHAZINE HCL 25 MG/ML IJ SOLN
6.2500 mg | INTRAMUSCULAR | Status: DC | PRN
  Administered 2016-02-14: 6.25 mg via INTRAVENOUS

## 2016-02-14 MED ORDER — OXYCODONE HCL 5 MG PO TABS: 5.0000 mg | ORAL_TABLET | Freq: Once | ORAL | Status: DC | PRN

## 2016-02-14 MED ORDER — DEXAMETHASONE SODIUM PHOSPHATE 10 MG/ML IJ SOLN
INTRAMUSCULAR | Status: AC
  Filled 2016-02-14: qty 1

## 2016-02-14 MED ORDER — MIDAZOLAM HCL 5 MG/5ML IJ SOLN
INTRAMUSCULAR | Status: DC | PRN
Start: 2016-02-14 — End: 2016-02-14
  Administered 2016-02-14: 2 mg via INTRAVENOUS

## 2016-02-14 MED ORDER — LACTATED RINGERS IV SOLN
INTRAVENOUS | Status: DC | PRN
  Administered 2016-02-14: 1000 mL

## 2016-02-14 MED ORDER — ONDANSETRON 4 MG PO TBDP: 4.0000 mg | ORAL_TABLET | Freq: Three times a day (TID) | ORAL | Status: DC | PRN

## 2016-02-14 MED ORDER — HYDROCODONE-ACETAMINOPHEN 5-325 MG PO TABS
1.0000 | ORAL_TABLET | ORAL | Status: DC | PRN
  Administered 2016-02-14: 2 via ORAL
  Administered 2016-02-14: 1 via ORAL
  Administered 2016-02-15 (×3): 2 via ORAL
  Filled 2016-02-14 (×3): qty 2
  Filled 2016-02-14: qty 1
  Filled 2016-02-14: qty 2

## 2016-02-14 MED ORDER — FENTANYL CITRATE (PF) 100 MCG/2ML IJ SOLN
INTRAMUSCULAR | Status: AC
  Filled 2016-02-14: qty 4

## 2016-02-14 MED ORDER — ROCURONIUM BROMIDE 10 MG/ML (PF) SYRINGE
PREFILLED_SYRINGE | INTRAVENOUS | Status: DC | PRN
  Administered 2016-02-14: 50 mg via INTRAVENOUS
  Administered 2016-02-14: 20 mg via INTRAVENOUS
  Administered 2016-02-14: 10 mg via INTRAVENOUS

## 2016-02-14 MED ORDER — PROPOFOL 10 MG/ML IV BOLUS
INTRAVENOUS | Status: DC | PRN
  Administered 2016-02-14: 200 mg via INTRAVENOUS

## 2016-02-14 MED ORDER — IOPAMIDOL (ISOVUE-300) INJECTION 61%
INTRAVENOUS | Status: DC | PRN
  Administered 2016-02-14: 2 mL

## 2016-02-14 MED ORDER — OXYCODONE HCL 5 MG/5ML PO SOLN
5.0000 mg | Freq: Once | ORAL | Status: DC | PRN
  Filled 2016-02-14: qty 5

## 2016-02-14 MED ORDER — FENTANYL CITRATE (PF) 100 MCG/2ML IJ SOLN
INTRAMUSCULAR | Status: DC | PRN
  Administered 2016-02-14 (×3): 100 ug via INTRAVENOUS

## 2016-02-14 MED ORDER — DEXAMETHASONE SODIUM PHOSPHATE 10 MG/ML IJ SOLN
INTRAMUSCULAR | Status: DC | PRN
  Administered 2016-02-14: 10 mg via INTRAVENOUS

## 2016-02-14 MED ORDER — CEFAZOLIN SODIUM-DEXTROSE 2-4 GM/100ML-% IV SOLN
INTRAVENOUS | Status: AC
  Filled 2016-02-14: qty 100

## 2016-02-14 MED ORDER — FENTANYL CITRATE (PF) 100 MCG/2ML IJ SOLN
25.0000 ug | INTRAMUSCULAR | Status: DC | PRN
  Administered 2016-02-14 (×2): 50 ug via INTRAVENOUS

## 2016-02-14 MED ORDER — ONDANSETRON HCL 4 MG/2ML IJ SOLN
INTRAMUSCULAR | Status: DC | PRN
  Administered 2016-02-14: 4 mg via INTRAVENOUS

## 2016-02-14 MED ORDER — ONDANSETRON HCL 4 MG/2ML IJ SOLN
INTRAMUSCULAR | Status: AC
  Filled 2016-02-14: qty 2

## 2016-02-14 MED ORDER — LIDOCAINE 2% (20 MG/ML) 5 ML SYRINGE
INTRAMUSCULAR | Status: DC | PRN
  Administered 2016-02-14: 75 mg via INTRAVENOUS

## 2016-02-14 MED ORDER — LACTATED RINGERS IV SOLN: INTRAVENOUS | Status: DC

## 2016-02-14 MED ORDER — LACTATED RINGERS IV SOLN
INTRAVENOUS | Status: DC | PRN
  Administered 2016-02-14: 07:00:00 via INTRAVENOUS

## 2016-02-14 MED ORDER — HYDROMORPHONE HCL 1 MG/ML IJ SOLN
INTRAMUSCULAR | Status: AC
  Filled 2016-02-14: qty 1

## 2016-02-14 MED ORDER — IOPAMIDOL (ISOVUE-300) INJECTION 61%
INTRAVENOUS | Status: AC
  Filled 2016-02-14: qty 50

## 2016-02-14 MED ORDER — SUGAMMADEX SODIUM 200 MG/2ML IV SOLN
INTRAVENOUS | Status: DC | PRN
  Administered 2016-02-14: 200 mg via INTRAVENOUS

## 2016-02-14 MED ORDER — KCL IN DEXTROSE-NACL 20-5-0.45 MEQ/L-%-% IV SOLN
INTRAVENOUS | Status: DC
  Administered 2016-02-14: 75 mL/h via INTRAVENOUS
  Administered 2016-02-15: 03:00:00 via INTRAVENOUS
  Filled 2016-02-14 (×3): qty 1000

## 2016-02-14 MED ORDER — LIDOCAINE 2% (20 MG/ML) 5 ML SYRINGE
INTRAMUSCULAR | Status: AC
  Filled 2016-02-14: qty 5

## 2016-02-14 MED ORDER — HEPARIN SODIUM (PORCINE) 5000 UNIT/ML IJ SOLN
5000.0000 [IU] | Freq: Three times a day (TID) | INTRAMUSCULAR | Status: DC
  Administered 2016-02-15: 5000 [IU] via SUBCUTANEOUS
  Filled 2016-02-14 (×2): qty 1

## 2016-02-14 MED ORDER — ROCURONIUM BROMIDE 50 MG/5ML IV SOSY
PREFILLED_SYRINGE | INTRAVENOUS | Status: AC
  Filled 2016-02-14: qty 5

## 2016-02-14 MED ORDER — BUPIVACAINE HCL (PF) 0.25 % IJ SOLN
INTRAMUSCULAR | Status: AC
  Filled 2016-02-14: qty 30

## 2016-02-14 MED ORDER — MIDAZOLAM HCL 2 MG/2ML IJ SOLN
INTRAMUSCULAR | Status: AC
  Filled 2016-02-14: qty 2

## 2016-02-14 MED ORDER — FENTANYL CITRATE (PF) 100 MCG/2ML IJ SOLN
INTRAMUSCULAR | Status: AC
  Filled 2016-02-14: qty 2

## 2016-02-14 MED ORDER — CEFAZOLIN SODIUM-DEXTROSE 2-4 GM/100ML-% IV SOLN
2.0000 g | INTRAVENOUS | Status: AC
  Administered 2016-02-14: 2 g via INTRAVENOUS
  Filled 2016-02-14: qty 100

## 2016-02-14 MED ORDER — PROMETHAZINE HCL 25 MG/ML IJ SOLN
INTRAMUSCULAR | Status: AC
  Filled 2016-02-14: qty 1

## 2016-02-14 MED ORDER — HYDROMORPHONE HCL 1 MG/ML IJ SOLN
0.2500 mg | INTRAMUSCULAR | Status: DC | PRN
  Administered 2016-02-14: 0.5 mg via INTRAVENOUS

## 2016-02-14 MED ORDER — IBUPROFEN 800 MG PO TABS: 800.0000 mg | ORAL_TABLET | Freq: Four times a day (QID) | ORAL | Status: DC | PRN

## 2016-02-14 MED ORDER — SUGAMMADEX SODIUM 200 MG/2ML IV SOLN
INTRAVENOUS | Status: AC
  Filled 2016-02-14: qty 2

## 2016-02-14 SURGICAL SUPPLY — 53 items
ADH SKN CLS APL DERMABOND .7 (GAUZE/BANDAGES/DRESSINGS) ×1
APL SKNCLS STERI-STRIP NONHPOA (GAUZE/BANDAGES/DRESSINGS)
APPLIER CLIP 5 13 M/L LIGAMAX5 (MISCELLANEOUS) ×3
APPLIER CLIP ROT 10 11.4 M/L (STAPLE)
APR CLP MED LRG 11.4X10 (STAPLE)
APR CLP MED LRG 5 ANG JAW (MISCELLANEOUS) ×1
BAG SPEC RTRVL LRG 6X4 10 (ENDOMECHANICALS) ×1
BENZOIN TINCTURE PRP APPL 2/3 (GAUZE/BANDAGES/DRESSINGS) IMPLANT
CABLE HIGH FREQUENCY MONO STRZ (ELECTRODE) ×3 IMPLANT
CHLORAPREP W/TINT 26ML (MISCELLANEOUS) ×3 IMPLANT
CHOLANGIOGRAM CATH TAUT (CATHETERS) ×3 IMPLANT
CLIP APPLIE 5 13 M/L LIGAMAX5 (MISCELLANEOUS) ×1 IMPLANT
CLIP APPLIE ROT 10 11.4 M/L (STAPLE) IMPLANT
CLOSURE WOUND 1/4X4 (GAUZE/BANDAGES/DRESSINGS) ×1
COVER MAYO STAND STRL (DRAPES) ×3 IMPLANT
COVER SURGICAL LIGHT HANDLE (MISCELLANEOUS) ×3 IMPLANT
DECANTER SPIKE VIAL GLASS SM (MISCELLANEOUS) ×3 IMPLANT
DERMABOND ADVANCED (GAUZE/BANDAGES/DRESSINGS) ×2
DERMABOND ADVANCED .7 DNX12 (GAUZE/BANDAGES/DRESSINGS) ×1 IMPLANT
DRAPE C-ARM 42X120 X-RAY (DRAPES) ×3 IMPLANT
ELECT REM PT RETURN 9FT ADLT (ELECTROSURGICAL) ×3
ELECTRODE REM PT RTRN 9FT ADLT (ELECTROSURGICAL) ×1 IMPLANT
GLOVE BIO SURGEON STRL SZ 6 (GLOVE) ×3 IMPLANT
GLOVE BIO SURGEON STRL SZ7 (GLOVE) ×3 IMPLANT
GLOVE BIO SURGEON STRL SZ7.5 (GLOVE) ×3 IMPLANT
GLOVE BIOGEL PI IND STRL 7.0 (GLOVE) ×1 IMPLANT
GLOVE BIOGEL PI IND STRL 7.5 (GLOVE) ×1 IMPLANT
GLOVE BIOGEL PI INDICATOR 7.0 (GLOVE) ×2
GLOVE BIOGEL PI INDICATOR 7.5 (GLOVE) ×2
GLOVE INDICATOR 6.5 STRL GRN (GLOVE) ×3 IMPLANT
GLOVE SURG SIGNA 7.5 PF LTX (GLOVE) ×3 IMPLANT
GOWN STRL REUS W/TWL XL LVL3 (GOWN DISPOSABLE) ×9 IMPLANT
HEMOSTAT SURGICEL 4X8 (HEMOSTASIS) IMPLANT
IRRIG SUCT STRYKERFLOW 2 WTIP (MISCELLANEOUS) ×3
IRRIGATION SUCT STRKRFLW 2 WTP (MISCELLANEOUS) ×1 IMPLANT
IV CATH 14GX2 1/4 (CATHETERS) ×3 IMPLANT
IV SET EXTENSION CATH 6 NF (IV SETS) ×3 IMPLANT
KIT BASIN OR (CUSTOM PROCEDURE TRAY) ×3 IMPLANT
POUCH SPECIMEN RETRIEVAL 10MM (ENDOMECHANICALS) ×3 IMPLANT
SCISSORS LAP 5X35 DISP (ENDOMECHANICALS) ×3 IMPLANT
SHEARS HARMONIC ACE PLUS 36CM (ENDOMECHANICALS) ×3 IMPLANT
SLEEVE ADV FIXATION 5X100MM (TROCAR) ×9 IMPLANT
STOPCOCK 4 WAY LG BORE MALE ST (IV SETS) ×3 IMPLANT
STRIP CLOSURE SKIN 1/4X4 (GAUZE/BANDAGES/DRESSINGS) ×2 IMPLANT
SUT MNCRL AB 4-0 PS2 18 (SUTURE) ×6 IMPLANT
SYR 10ML ECCENTRIC (SYRINGE) ×3 IMPLANT
TOWEL OR 17X26 10 PK STRL BLUE (TOWEL DISPOSABLE) ×3 IMPLANT
TOWEL OR NON WOVEN STRL DISP B (DISPOSABLE) ×6 IMPLANT
TRAY LAPAROSCOPIC (CUSTOM PROCEDURE TRAY) ×3 IMPLANT
TROCAR ADV FIXATION 11X100MM (TROCAR) IMPLANT
TROCAR ADV FIXATION 5X100MM (TROCAR) ×3 IMPLANT
TROCAR XCEL BLUNT TIP 100MML (ENDOMECHANICALS) ×3 IMPLANT
TUBING INSUF HEATED (TUBING) ×3 IMPLANT
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
## 2016-02-14 NOTE — Anesthesia Postprocedure Evaluation (Addendum)
Anesthesia Post Note  Patient: Alexander Nguyen  Procedure(s) Performed: Procedure(s) (LRB): LAPAROSCOPIC CHOLECYSTECTOMY WITH INTRAOPERATIVE CHOLANGIOGRAM AND LYSIS OF ADHESIONS (N/A)  Patient location during evaluation: PACU Anesthesia Type: General Level of consciousness: awake and alert Pain management: pain level controlled Vital Signs Assessment: post-procedure vital signs reviewed and stable Respiratory status: spontaneous breathing, nonlabored ventilation, respiratory function stable and patient connected to nasal cannula oxygen Cardiovascular status: blood pressure returned to baseline and stable Postop Assessment: no signs of nausea or vomiting Anesthetic complications: no       Last Vitals:  Vitals:   02/14/16 1130 02/14/16 1144  BP: 119/80 118/76  Pulse: 73 64  Resp: 16 16  Temp: 37 C 36.9 C    Last Pain:  Vitals:   02/14/16 1130  PainSc: 0-No pain                 Sonia Bromell

## 2016-02-14 NOTE — Op Note (Addendum)
02/14/2016  9:29 AM  PATIENT:  Alexander Nguyen, 36 y.o., male, MRN: [id number]  PREOP DIAGNOSIS:  CHRONIC CHOLECYSTITIS WITH CHOLELITHIASIS  POSTOP DIAGNOSIS:   CHRONIC CHOLECYSTITIS WITH CHOLELITHIASIS, extensive intra-abdominal adhesions [photos in the chart]  PROCEDURE:   Procedure(s): LAPAROSCOPIC CHOLECYSTECTOMY WITH INTRAOPERATIVE CHOLANGIOGRAM (5 port) AND LYSIS OF ADHESIONS (45 minutes)  SURGEON:   Alexander Kin, M.D.  Threasa HeadsMilford Cage, M.D.  ANESTHESIA:   general  Anesthesiologist: Val Eagle, MD CRNA: Elyn Peers, CRNA  General  ASA: 2  EBL:  minimal  ml  BLOOD ADMINISTERED: none  DRAINS: none   LOCAL MEDICATIONS USED:   30 cc 1/4% marcaine  SPECIMEN:   Gall bladder  COUNTS CORRECT:  YES  INDICATIONS FOR PROCEDURE:  Alexander Nguyen is a 36 y.o. (DOB: 01-05-1981) white male whose primary care physician is Gaye Alken, MD and comes for cholecystectomy.   The indications and risks of the gall bladder surgery were explained to the patient.  The risks include, but are not limited to, infection, bleeding, common bile duct injury and open surgery.  SURGERY:  The patient was taken to OR room #2 at [facility].  The abdomen was prepped with chloroprep.  The patient was given 2 gm Ancef at the beginning of the operation.   A time out was held and the surgical checklist run.   I accessed his abdominal cavity through a 5 mm Optiview Ethicon trocar in the left upper quadrant.  Four additional trocars were inserted: a 5 mm trocar in the sub-xiphoid location, a 5 mm trocar in the right mid subcostal area, and a 5 mm trocar in the right lateral subcostal area, and a 5 mm trocar to the right of the umbilicus for the camera.   The abdomen was explored and he had extensive adhesions from his prior surgery.  The liver, stomach, and bowel that could be seen were unremarkable, but the exploration was limited by his adhesions.  I spent about 45 minutes  taking down the adhesions to expose the gall bladder.  This took approaching the adhesions from multiple ports.  I used primarily scissors to take down the adhesions and expose the gall bladder.   The gall bladder was caught up in the adhesions and the right lobe of the liver stuck to the anterior peritoneal surface.  There was no way to rotate the gall bladder cephalad.  Disssection was carried down to the gall bladder/cystic duct junction and the cystic duct isolated.  A clip was placed on the gall bladder side of the cystic duct.   An intra-operative cholangiogram was shot.   The intra-operative cholangiogram was shot using a cut off Taut catheter placed through a 14 gauge angiocath in the RUQ.  The Taut catheter was inserted in the cut cystic duct and secured with an endoclip.  A cholangiogram was shot with 8 cc of 1/2 strength Isoview.  Using fluoroscopy, the cholangiogram showed the flow of contrast into the common bile duct, up the hepatic radicals, and into the duodenum.  There was no mass or obstruction.  This was a normal intra-operative cholangiogram.   The Taut catheter was removed.  The cystic duct was tripley endoclipped and the cystic artery was identified and clipped.  The gall bladder was bluntly and sharpley dissected from the gall bladder bed.   After the gall bladder was removed from the liver, the gall bladder bed and Triangle of [place] were inspected.  There was no bleeding  or bile leak.  The gall bladder was placed in a endocatch bag and delivered through the right upper quadrant trocar that I enlarged to a 12 mm trocar.  The abdomen was irrigated with 1,000 cc saline.   I have a surgeon as a first assist to retract, expose, and assist on this difficult operation.   The trocars were then removed.  I infiltrated 30 cc of 1/4% Marcaine into the incisions.  The skin closed with 4-0 Monocryl.  The skin was painted with DermaBond.  The patient's sponge and needle count were correct.  The  patient was transported to the RR in good condition.  Alexander Kinavid Siomara Burkel, MD, [facility] Pager: [phone] Office phone:  [phone]

## 2016-02-14 NOTE — Anesthesia Procedure Notes (Signed)
Procedure Name: Intubation Date/Time: 02/14/2016 7:35 AM Performed by: Elyn PeersALLEN, Daliyah Sramek J Pre-anesthesia Checklist: Patient identified, Emergency Drugs available, Suction available, Patient being monitored and Timeout performed Patient Re-evaluated:Patient Re-evaluated prior to inductionOxygen Delivery Method: Circle system utilized Preoxygenation: Pre-oxygenation with 100% oxygen Intubation Type: IV induction Ventilation: Mask ventilation without difficulty Laryngoscope Size: Miller and 3 Grade View: Grade I Tube type: Oral Tube size: 8.0 mm Number of attempts: 1 Airway Equipment and Method: Stylet Placement Confirmation: ETT inserted through vocal cords under direct vision,  positive ETCO2 and breath sounds checked- equal and bilateral Secured at: 23 cm Tube secured with: Tape Dental Injury: Teeth and Oropharynx as per pre-operative assessment

## 2016-02-14 NOTE — Transfer of Care (Signed)
Immediate Anesthesia Transfer of Care Note  Patient: Alexander Nguyen  Procedure(s) Performed: Procedure(s): LAPAROSCOPIC CHOLECYSTECTOMY WITH INTRAOPERATIVE CHOLANGIOGRAM AND LYSIS OF ADHESIONS (N/A)  Patient Location: PACU  Anesthesia Type:General  Level of Consciousness: awake, alert  and oriented  Airway & Oxygen Therapy: Patient Spontanous Breathing and Patient connected to face mask oxygen  Post-op Assessment: Report given to RN and Post -op Vital signs reviewed and stable  Post vital signs: Reviewed and stable  Last Vitals:  Vitals:   02/14/16 0535  BP: 118/74  Pulse: 85  Resp: 16  Temp: 36.7 C    Last Pain:  Vitals:   02/14/16 0614  PainSc: 1          Complications: No apparent anesthesia complications

## 2016-02-14 NOTE — Interval H&P Note (Signed)
History and Physical Interval Note:  02/14/2016 7:23 AM  Alexander Nguyen  has presented today for surgery, with the diagnosis of CHRONIC CHOLECYSTITIS WITH CHOLELITHIASIS  The various methods of treatment have been discussed with the patient and family.  His wife is here with him.  After consideration of risks, benefits and other options for treatment, the patient has consented to  Procedure(s): LAPAROSCOPIC CHOLECYSTECTOMY WITH INTRAOPERATIVE CHOLANGIOGRAM (N/A) as a surgical intervention .  The patient's history has been reviewed, patient examined, no change in status, stable for surgery.  I have reviewed the patient's chart and labs.  Questions were answered to the patient's satisfaction.     Narvel Kozub,Melvern H

## 2016-02-15 DIAGNOSIS — F17210 Nicotine dependence, cigarettes, uncomplicated: Secondary | ICD-10-CM | POA: Diagnosis not present

## 2016-02-15 DIAGNOSIS — Z905 Acquired absence of kidney: Secondary | ICD-10-CM | POA: Diagnosis not present

## 2016-02-15 DIAGNOSIS — K8010 Calculus of gallbladder with chronic cholecystitis without obstruction: Secondary | ICD-10-CM | POA: Diagnosis not present

## 2016-02-15 DIAGNOSIS — M549 Dorsalgia, unspecified: Secondary | ICD-10-CM | POA: Diagnosis not present

## 2016-02-15 DIAGNOSIS — Z87828 Personal history of other (healed) physical injury and trauma: Secondary | ICD-10-CM | POA: Diagnosis not present

## 2016-02-15 DIAGNOSIS — K660 Peritoneal adhesions (postprocedural) (postinfection): Secondary | ICD-10-CM | POA: Diagnosis not present

## 2016-02-15 DIAGNOSIS — F419 Anxiety disorder, unspecified: Secondary | ICD-10-CM | POA: Diagnosis not present

## 2016-02-15 DIAGNOSIS — R531 Weakness: Secondary | ICD-10-CM | POA: Diagnosis not present

## 2016-02-15 MED ORDER — OXYCODONE HCL 5 MG PO TABS: 5.0000 mg | ORAL_TABLET | Freq: Four times a day (QID) | ORAL | 0 refills | Status: DC | PRN
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
## 2016-02-15 NOTE — Progress Notes (Signed)
Pt was tolerating diet and activity. Pt was discharged and instructions were given.  Understanding of instructions was given and prescription was given.

## 2016-02-15 NOTE — Discharge Instructions (Signed)
[facility] - DISCHARGE INSTRUCTIONS TO PATIENT  Activity:  Driving - May drive when off pain meds   Lifting - No lifting more than 15 pounds for 10 days, then no limit  Wound Care:   May shower starting tomorrow  Diet:  As tolerated  Follow up appointment:  Call Dr. Allene PyoNewman's office [facility] at [phone] for an appointment in 2 to 3 weeks  Medications and dosages:  Resume your home medications.  You have a prescription for:  Percocet  Call Dr. Ezzard StandingNewman or his office  [phone]) if you have:  Temperature greater than 100.4,  Persistent nausea and vomiting,  Severe uncontrolled pain,  Redness, tenderness, or signs of infection (pain, swelling, redness, odor or green/yellow discharge around the site),  Difficulty breathing, headache or visual disturbances,  Any other questions or concerns you may have after discharge.  In an emergency, call 911 or go to an Emergency Department at a nearby hospital.

## 2016-02-28 DIAGNOSIS — Z9049 Acquired absence of other specified parts of digestive tract: Secondary | ICD-10-CM | POA: Diagnosis not present

## 2016-02-28 DIAGNOSIS — F1010 Alcohol abuse, uncomplicated: Secondary | ICD-10-CM | POA: Diagnosis not present

## 2016-06-25 NOTE — Addendum Note (Signed)
Addendum  created 06/25/16 1022 by Jerene Yeager, MD   Sign clinical note    

## 2016-08-14 DIAGNOSIS — Z905 Acquired absence of kidney: Secondary | ICD-10-CM | POA: Diagnosis not present

## 2016-08-14 DIAGNOSIS — Z87828 Personal history of other (healed) physical injury and trauma: Secondary | ICD-10-CM | POA: Diagnosis not present

## 2016-08-14 DIAGNOSIS — M545 Low back pain: Secondary | ICD-10-CM | POA: Diagnosis not present

## 2016-08-14 DIAGNOSIS — Z9049 Acquired absence of other specified parts of digestive tract: Secondary | ICD-10-CM | POA: Diagnosis not present

## 2016-08-14 DIAGNOSIS — Z87898 Personal history of other specified conditions: Secondary | ICD-10-CM | POA: Diagnosis not present

## 2016-08-17 DIAGNOSIS — K909 Intestinal malabsorption, unspecified: Secondary | ICD-10-CM | POA: Diagnosis not present

## 2016-08-17 DIAGNOSIS — Z905 Acquired absence of kidney: Secondary | ICD-10-CM | POA: Diagnosis not present

## 2016-08-17 DIAGNOSIS — Z136 Encounter for screening for cardiovascular disorders: Secondary | ICD-10-CM | POA: Diagnosis not present

## 2016-08-17 DIAGNOSIS — Z9049 Acquired absence of other specified parts of digestive tract: Secondary | ICD-10-CM | POA: Diagnosis not present

## 2016-10-26 DIAGNOSIS — F4310 Post-traumatic stress disorder, unspecified: Secondary | ICD-10-CM | POA: Diagnosis not present

## 2016-11-08 DIAGNOSIS — F4310 Post-traumatic stress disorder, unspecified: Secondary | ICD-10-CM | POA: Diagnosis not present

## 2017-05-09 ENCOUNTER — Encounter (HOSPITAL_COMMUNITY): Payer: Self-pay | Admitting: Emergency Medicine

## 2017-05-09 ENCOUNTER — Emergency Department (HOSPITAL_COMMUNITY)
Admission: EM | Admit: 2017-05-09 | Discharge: 2017-05-09 | Disposition: A | Discharge status: Home / Self Care | Payer: Medicare HMO | Attending: Emergency Medicine | Admitting: Emergency Medicine

## 2017-05-09 DIAGNOSIS — M62830 Muscle spasm of back: Secondary | ICD-10-CM | POA: Insufficient documentation

## 2017-05-09 DIAGNOSIS — F17210 Nicotine dependence, cigarettes, uncomplicated: Secondary | ICD-10-CM | POA: Diagnosis not present

## 2017-05-09 DIAGNOSIS — M545 Low back pain: Secondary | ICD-10-CM | POA: Diagnosis present

## 2017-05-09 MED ORDER — CYCLOBENZAPRINE HCL 10 MG PO TABS: 10.0000 mg | ORAL_TABLET | Freq: Two times a day (BID) | ORAL | 0 refills | Status: AC | PRN

## 2017-05-09 MED ORDER — HYDROCODONE-ACETAMINOPHEN 5-325 MG PO TABS
1.0000 | ORAL_TABLET | Freq: Once | ORAL | Status: AC
  Administered 2017-05-09: 1 via ORAL
  Filled 2017-05-09: qty 1

## 2017-05-09 MED ORDER — CYCLOBENZAPRINE HCL 10 MG PO TABS
10.0000 mg | ORAL_TABLET | Freq: Once | ORAL | Status: AC
  Administered 2017-05-09: 10 mg via ORAL
  Filled 2017-05-09: qty 1

## 2017-05-09 MED ORDER — NAPROXEN 500 MG PO TABS: 500.0000 mg | ORAL_TABLET | Freq: Two times a day (BID) | ORAL | 0 refills | Status: AC
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
## 2017-05-09 NOTE — Discharge Instructions (Addendum)
Do not drive while taking the muscle relaxer as it will make you sleepy. Follow up with your primary care doctor.
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
## 2017-05-12 ENCOUNTER — Other Ambulatory Visit: Payer: Self-pay

## 2017-05-12 ENCOUNTER — Encounter (HOSPITAL_COMMUNITY): Payer: Self-pay

## 2017-05-12 ENCOUNTER — Emergency Department (HOSPITAL_COMMUNITY)
Admission: EM | Admit: 2017-05-12 | Discharge: 2017-05-12 | Disposition: A | Discharge status: Home / Self Care | Payer: Medicare HMO | Attending: Emergency Medicine | Admitting: Emergency Medicine

## 2017-05-12 DIAGNOSIS — F17210 Nicotine dependence, cigarettes, uncomplicated: Secondary | ICD-10-CM | POA: Insufficient documentation

## 2017-05-12 DIAGNOSIS — G8929 Other chronic pain: Secondary | ICD-10-CM | POA: Diagnosis not present

## 2017-05-12 DIAGNOSIS — R531 Weakness: Secondary | ICD-10-CM | POA: Diagnosis not present

## 2017-05-12 DIAGNOSIS — G8921 Chronic pain due to trauma: Secondary | ICD-10-CM | POA: Insufficient documentation

## 2017-05-12 DIAGNOSIS — M545 Low back pain: Secondary | ICD-10-CM | POA: Diagnosis not present

## 2017-05-12 MED ORDER — DEXAMETHASONE SODIUM PHOSPHATE 10 MG/ML IJ SOLN
10.0000 mg | Freq: Once | INTRAMUSCULAR | Status: AC
  Administered 2017-05-12: 10 mg via INTRAMUSCULAR
  Filled 2017-05-12: qty 1

## 2017-05-12 MED ORDER — HYDROCODONE-ACETAMINOPHEN 5-325 MG PO TABS
1.0000 | ORAL_TABLET | Freq: Once | ORAL | Status: AC
  Administered 2017-05-12: 1 via ORAL
  Filled 2017-05-12: qty 1

## 2017-05-12 MED ORDER — KETOROLAC TROMETHAMINE 30 MG/ML IJ SOLN
30.0000 mg | Freq: Once | INTRAMUSCULAR | Status: AC
  Administered 2017-05-12: 30 mg via INTRAMUSCULAR
  Filled 2017-05-12: qty 1

## 2017-05-12 MED ORDER — DICLOFENAC SODIUM 1 % TD GEL: 2.0000 g | Freq: Four times a day (QID) | TRANSDERMAL | 0 refills | Status: AC
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
## 2017-05-12 NOTE — ED Triage Notes (Signed)
Right lower back pain. Pt states pain in right leg. Pt states that pain started Wednesday after doing yard work. Hx of back issues

## 2017-05-12 NOTE — Discharge Instructions (Addendum)
Please read instructions below. Continue taking the naproxen twice daily with meals for pain.  You can take Tylenol with this medication every 4-6 hours for added pain relief. You can continue taking the Flexeril for muscle spasm. You can apply the voltaren gel to your back up to 4 times daily. Apply ice or heat to your back for symptom relief. Schedule an appointment with your back specialist to follow-up on your worsening back pain. Return to the ER for new weakness in your extremities, ability to empty your bladder or hold your bowels, fever.
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
## 2017-07-18 IMAGING — RF DG CHOLANGIOGRAM OPERATIVE
1 series · 3 of 3 positions shown · non-contrast
Comparison: None.

CLINICAL DATA: Right upper quadrant pain

EXAM:
INTRAOPERATIVE CHOLANGIOGRAM
TECHNIQUE: Cholangiographic images from the C-arm fluoroscopic device were
submitted for interpretation post-operatively. Please see the
procedural report for the amount of contrast and the fluoroscopy
time utilized.

[Series 1: run · 3 of 44 frames shown]
[frame 7/44]
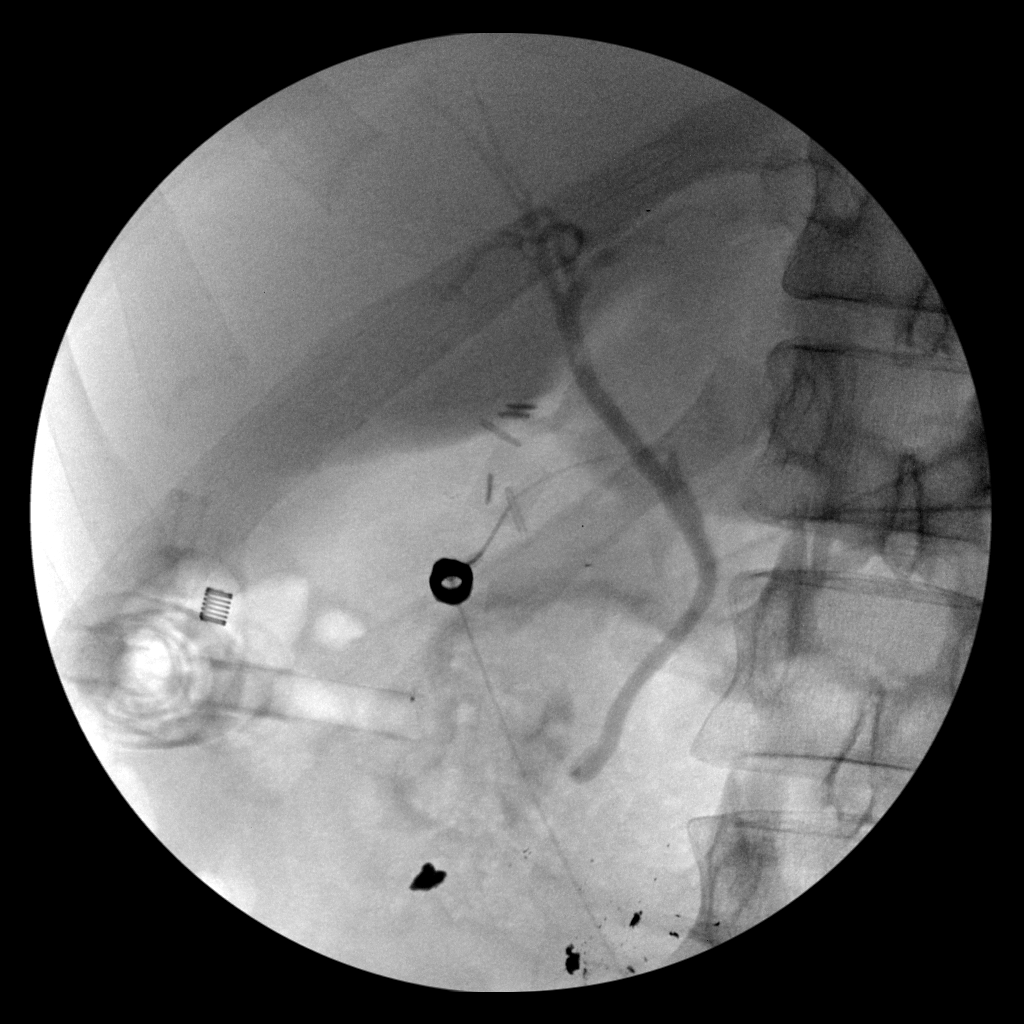
[frame 23/44]
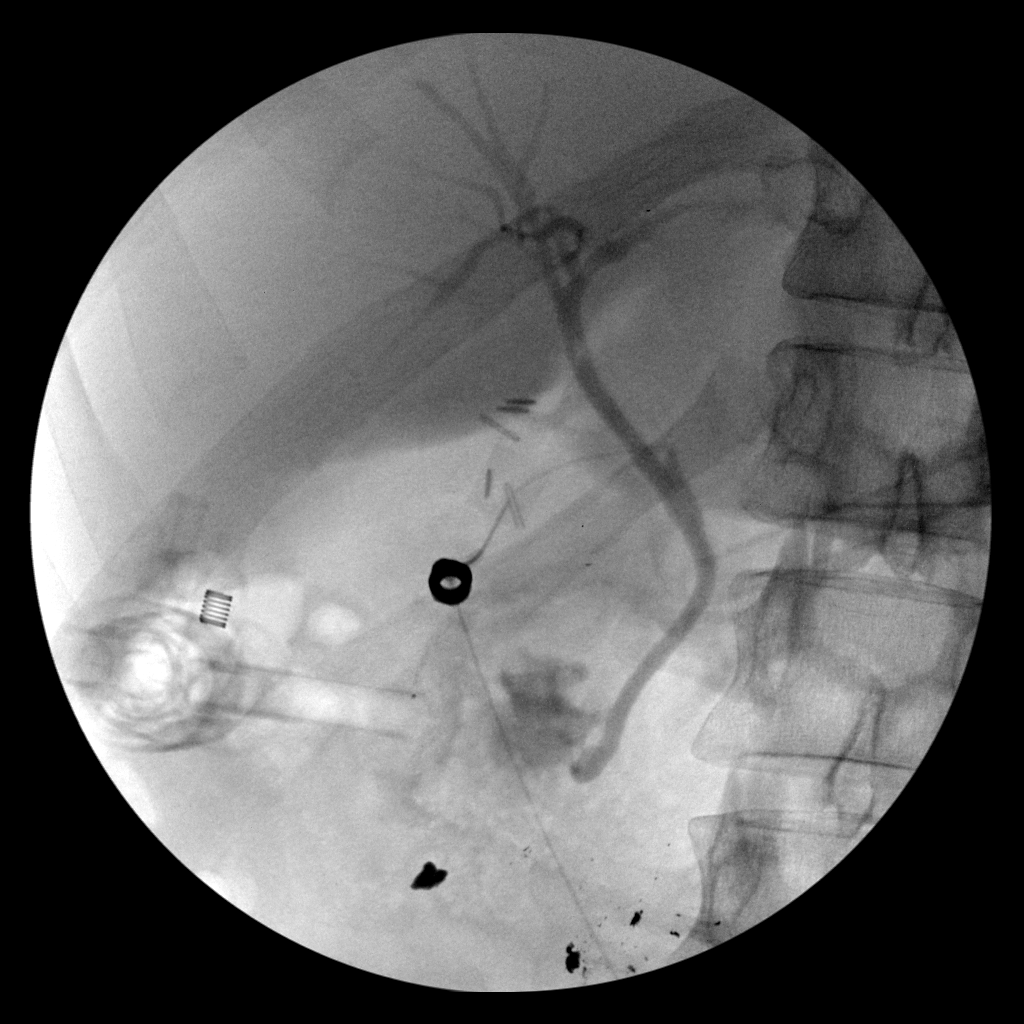
[frame 38/44]
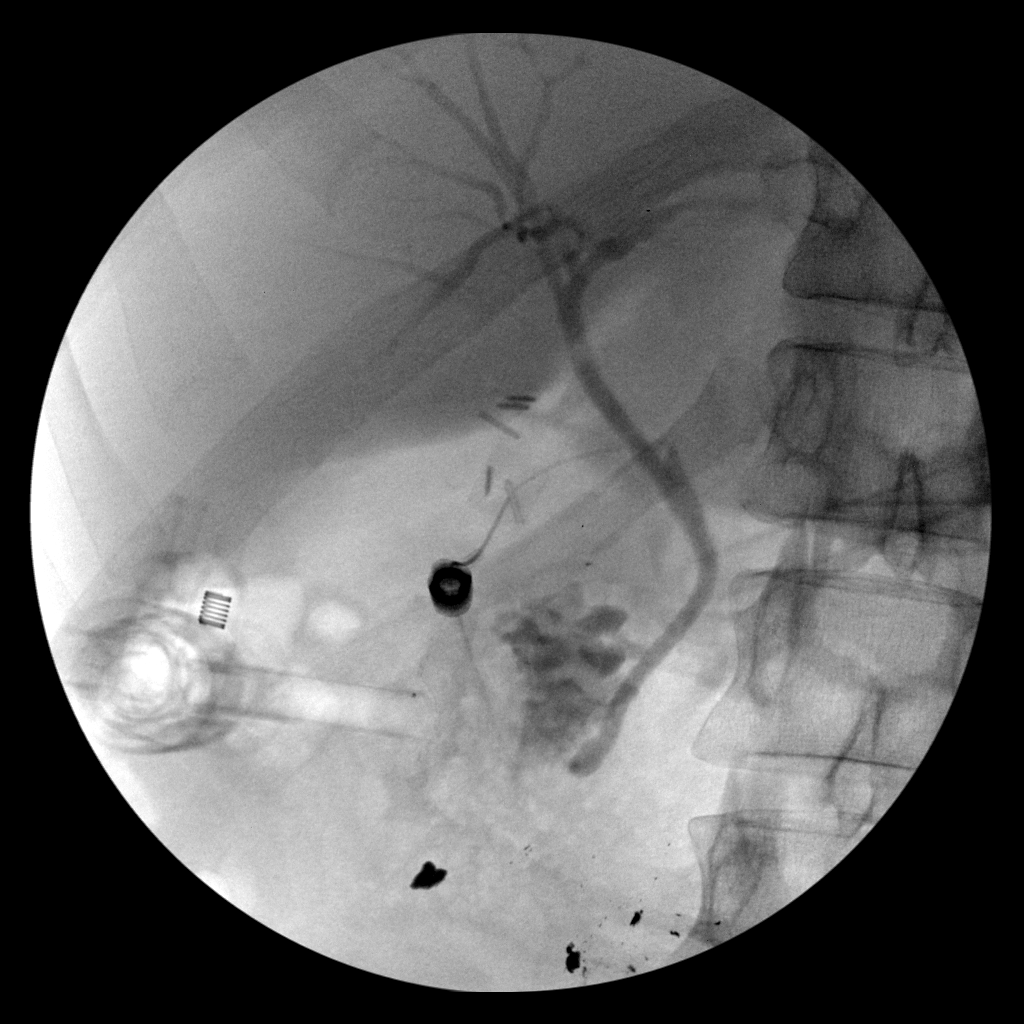

[3 of 3 positions shown; findings below may reference images not displayed]

FINDINGS: Contrast fills the biliary tree and duodenum. There is an
ill-defined filling defect in the upper common hepatic duct.
IMPRESSION: Possible duct stone in the common hepatic duct. If the patient
develops pain, nausea, or fever with elevated liver function tests,
consider MRCP.

## 2017-12-31 DIAGNOSIS — M79604 Pain in right leg: Secondary | ICD-10-CM | POA: Diagnosis not present

## 2018-04-25 DIAGNOSIS — S61200A Unspecified open wound of right index finger without damage to nail, initial encounter: Secondary | ICD-10-CM | POA: Diagnosis not present

## 2018-05-02 DIAGNOSIS — S61200D Unspecified open wound of right index finger without damage to nail, subsequent encounter: Secondary | ICD-10-CM | POA: Diagnosis not present

## 2018-05-10 DIAGNOSIS — L03011 Cellulitis of right finger: Secondary | ICD-10-CM | POA: Diagnosis not present

## 2018-10-27 DIAGNOSIS — Z6824 Body mass index (BMI) 24.0-24.9, adult: Secondary | ICD-10-CM | POA: Diagnosis not present

## 2018-10-27 DIAGNOSIS — S3992XA Unspecified injury of lower back, initial encounter: Secondary | ICD-10-CM | POA: Diagnosis not present

## 2018-10-27 DIAGNOSIS — R197 Diarrhea, unspecified: Secondary | ICD-10-CM | POA: Diagnosis not present

## 2018-10-27 DIAGNOSIS — Z9049 Acquired absence of other specified parts of digestive tract: Secondary | ICD-10-CM | POA: Diagnosis not present

## 2018-10-27 DIAGNOSIS — Z0000 Encounter for general adult medical examination without abnormal findings: Secondary | ICD-10-CM | POA: Diagnosis not present

## 2018-10-27 DIAGNOSIS — Z905 Acquired absence of kidney: Secondary | ICD-10-CM | POA: Diagnosis not present

## 2019-01-06 DIAGNOSIS — Z87898 Personal history of other specified conditions: Secondary | ICD-10-CM | POA: Diagnosis not present

## 2019-02-10 DIAGNOSIS — F1020 Alcohol dependence, uncomplicated: Secondary | ICD-10-CM | POA: Diagnosis not present

## 2019-02-11 DIAGNOSIS — F1020 Alcohol dependence, uncomplicated: Secondary | ICD-10-CM | POA: Diagnosis not present

## 2019-02-13 DIAGNOSIS — F1020 Alcohol dependence, uncomplicated: Secondary | ICD-10-CM | POA: Diagnosis not present

## 2019-02-16 DIAGNOSIS — F1020 Alcohol dependence, uncomplicated: Secondary | ICD-10-CM | POA: Diagnosis not present

## 2019-02-17 DIAGNOSIS — F1020 Alcohol dependence, uncomplicated: Secondary | ICD-10-CM | POA: Diagnosis not present

## 2019-02-18 DIAGNOSIS — F1020 Alcohol dependence, uncomplicated: Secondary | ICD-10-CM | POA: Diagnosis not present

## 2019-02-20 DIAGNOSIS — F1020 Alcohol dependence, uncomplicated: Secondary | ICD-10-CM | POA: Diagnosis not present

## 2019-02-23 DIAGNOSIS — F1020 Alcohol dependence, uncomplicated: Secondary | ICD-10-CM | POA: Diagnosis not present

## 2019-02-24 DIAGNOSIS — F1020 Alcohol dependence, uncomplicated: Secondary | ICD-10-CM | POA: Diagnosis not present

## 2019-02-25 DIAGNOSIS — F1020 Alcohol dependence, uncomplicated: Secondary | ICD-10-CM | POA: Diagnosis not present

## 2019-02-27 DIAGNOSIS — F1020 Alcohol dependence, uncomplicated: Secondary | ICD-10-CM | POA: Diagnosis not present

## 2019-03-02 DIAGNOSIS — F1020 Alcohol dependence, uncomplicated: Secondary | ICD-10-CM | POA: Diagnosis not present

## 2019-03-03 DIAGNOSIS — F1020 Alcohol dependence, uncomplicated: Secondary | ICD-10-CM | POA: Diagnosis not present

## 2019-03-04 DIAGNOSIS — F1020 Alcohol dependence, uncomplicated: Secondary | ICD-10-CM | POA: Diagnosis not present

## 2019-03-05 DIAGNOSIS — F1020 Alcohol dependence, uncomplicated: Secondary | ICD-10-CM | POA: Diagnosis not present

## 2019-03-06 DIAGNOSIS — F1020 Alcohol dependence, uncomplicated: Secondary | ICD-10-CM | POA: Diagnosis not present

## 2019-03-19 DIAGNOSIS — F329 Major depressive disorder, single episode, unspecified: Secondary | ICD-10-CM | POA: Diagnosis not present

## 2019-03-19 DIAGNOSIS — F411 Generalized anxiety disorder: Secondary | ICD-10-CM | POA: Diagnosis not present

## 2019-03-19 DIAGNOSIS — R37 Sexual dysfunction, unspecified: Secondary | ICD-10-CM | POA: Diagnosis not present

## 2019-05-19 DIAGNOSIS — M549 Dorsalgia, unspecified: Secondary | ICD-10-CM | POA: Diagnosis not present

## 2019-10-20 DIAGNOSIS — Z202 Contact with and (suspected) exposure to infections with a predominantly sexual mode of transmission: Secondary | ICD-10-CM | POA: Diagnosis not present
